# Patient Record
Sex: Female | Born: 1950 | Race: White | Hispanic: Yes | Smoking: Never smoker
Health system: Southern US, Community
[De-identification: ages and names within clinical notes are randomized; demographics above are authoritative.]

## PROBLEM LIST (undated history)

## (undated) DIAGNOSIS — T7840XA Allergy, unspecified, initial encounter: Secondary | ICD-10-CM

## (undated) DIAGNOSIS — R7301 Impaired fasting glucose: Secondary | ICD-10-CM

## (undated) DIAGNOSIS — I10 Essential (primary) hypertension: Secondary | ICD-10-CM

## (undated) DIAGNOSIS — F419 Anxiety disorder, unspecified: Secondary | ICD-10-CM

## (undated) DIAGNOSIS — M1990 Unspecified osteoarthritis, unspecified site: Secondary | ICD-10-CM

## (undated) DIAGNOSIS — F32A Depression, unspecified: Secondary | ICD-10-CM

## (undated) DIAGNOSIS — E559 Vitamin D deficiency, unspecified: Secondary | ICD-10-CM

## (undated) DIAGNOSIS — F329 Major depressive disorder, single episode, unspecified: Secondary | ICD-10-CM

## (undated) DIAGNOSIS — E785 Hyperlipidemia, unspecified: Secondary | ICD-10-CM

## (undated) HISTORY — DX: Hyperlipidemia, unspecified: E78.5

## (undated) HISTORY — DX: Vitamin D deficiency, unspecified: E55.9

## (undated) HISTORY — DX: Depression, unspecified: F32.A

## (undated) HISTORY — DX: Essential (primary) hypertension: I10

## (undated) HISTORY — DX: Major depressive disorder, single episode, unspecified: F32.9

## (undated) HISTORY — DX: Unspecified osteoarthritis, unspecified site: M19.90

## (undated) HISTORY — DX: Impaired fasting glucose: R73.01

## (undated) HISTORY — DX: Anxiety disorder, unspecified: F41.9

## (undated) HISTORY — DX: Allergy, unspecified, initial encounter: T78.40XA

## (undated) HISTORY — PX: TUBAL LIGATION: SHX77

---

## 2009-09-26 ENCOUNTER — Encounter: Admission: RE | Admit: 2009-09-26 | Discharge: 2009-09-26 | Payer: Self-pay | Admitting: Family Medicine

## 2011-04-09 LAB — HM COLONOSCOPY

## 2011-04-18 IMAGING — OT DG DXA BONE DENSITY STUDY HL7
4 series · 4 of 4 positions shown · non-contrast
Comparison: None.

CLINICAL DATA: 58-year-old postmenopausal female with vitamin D
deficiency and hypertension.  The patient takes vitamin D.  She
took hormones for 1 year in the past.

[Series 1: — · left · 1 of 1 slices shown (1 of 4)]
[im 1/1]
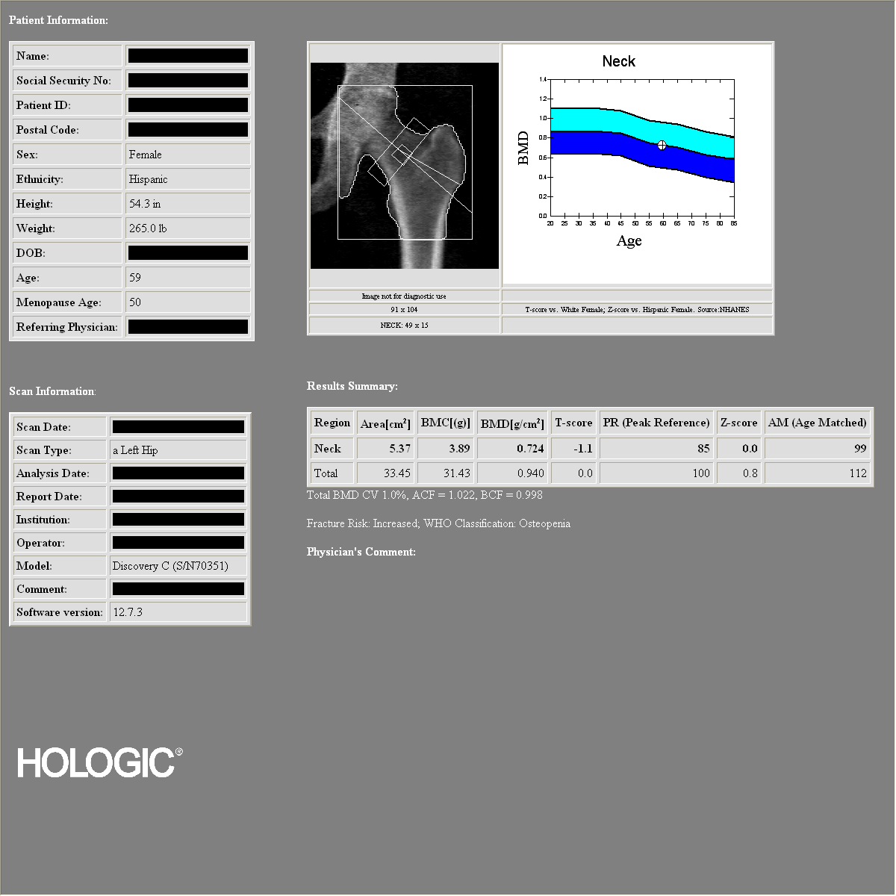

[Series 2: — · 1 of 1 slices shown (2 of 4)]
[im 1/1]
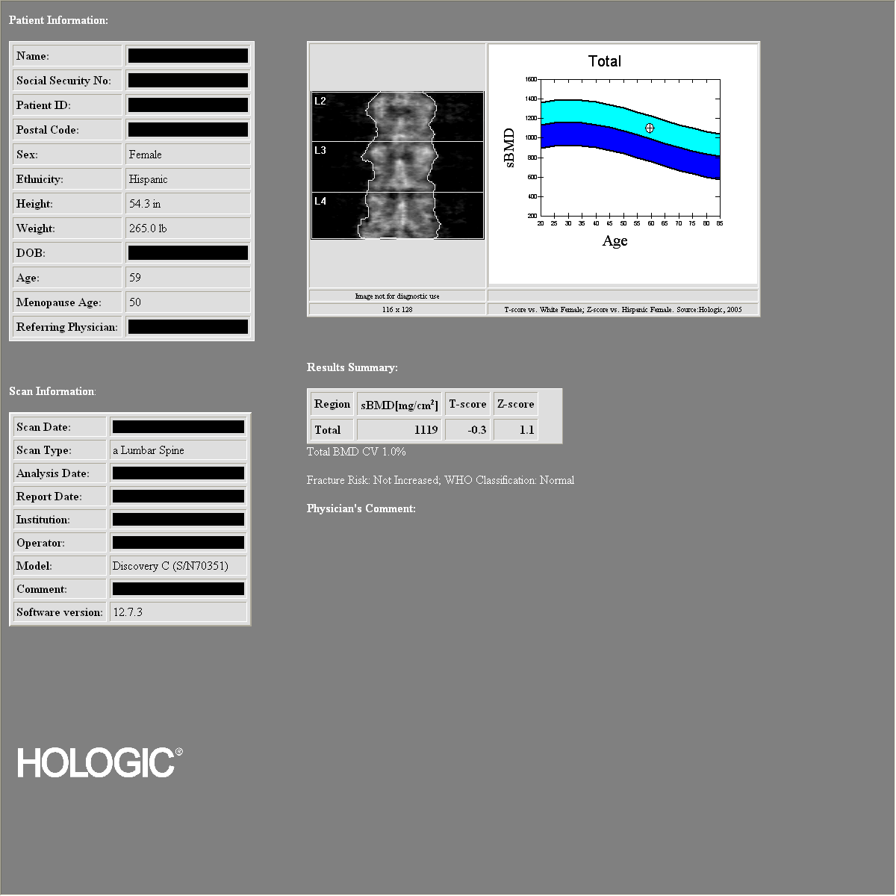

[Series 3: — · left · 1 of 1 slices shown (3 of 4)]
[im 1/1]
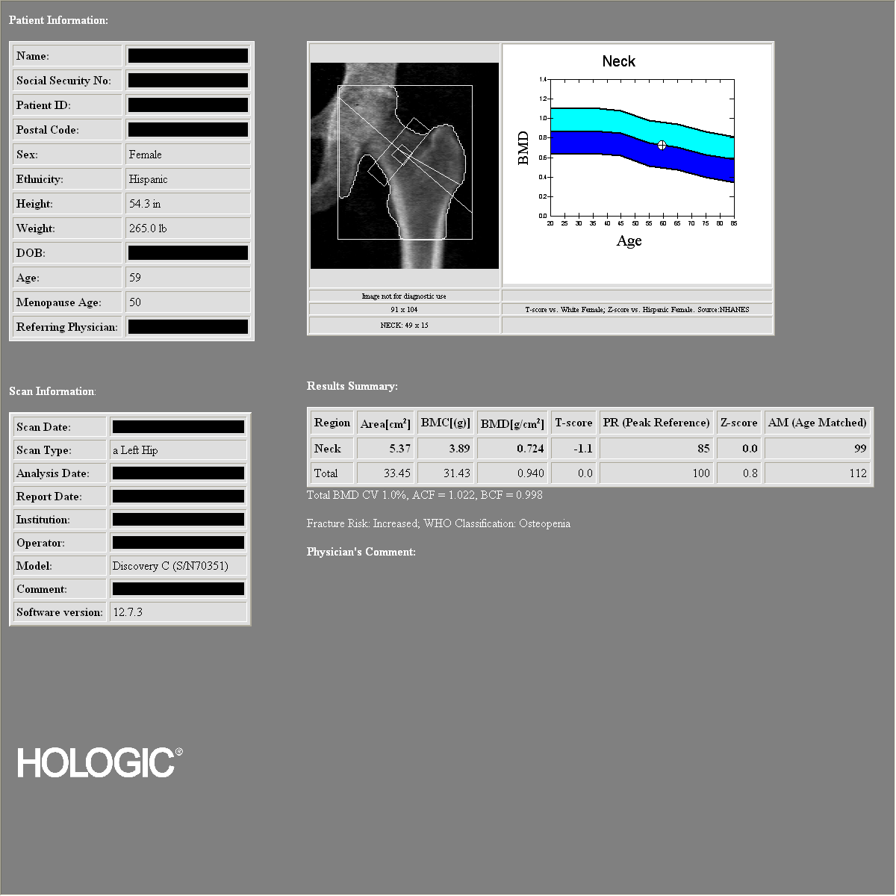

[Series 4: — · 1 of 1 slices shown (4 of 4)]
[im 1/1]
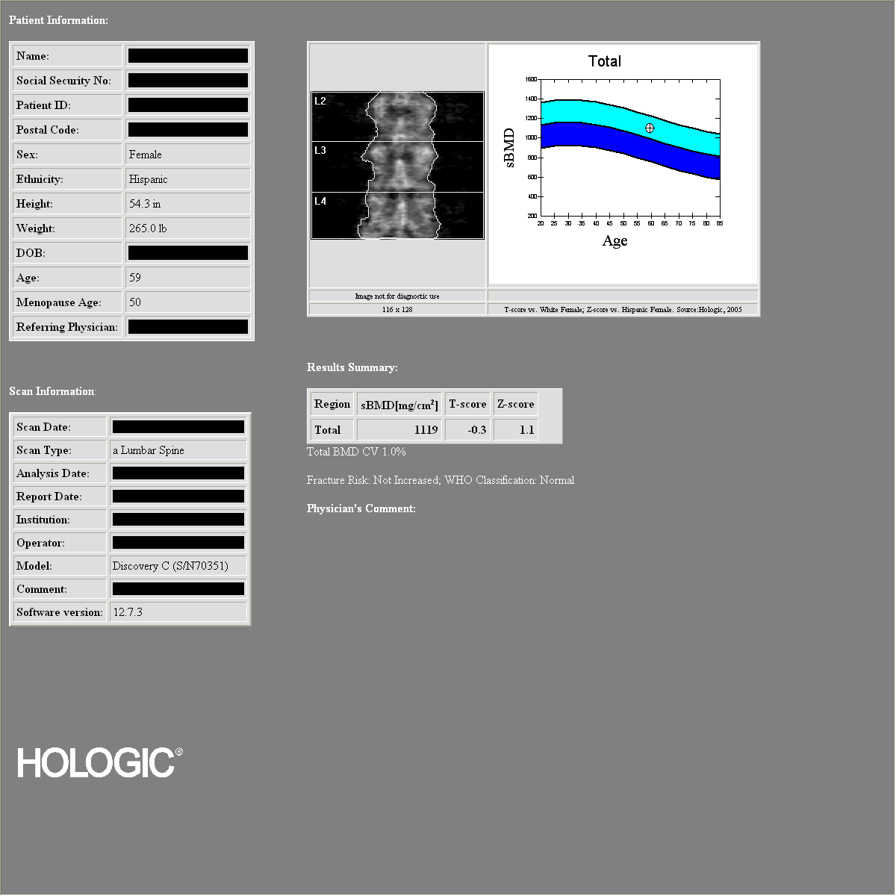

[4 of 4 positions shown; findings below may reference images not displayed]

DUAL X-RAY ABSORPTIOMETRY (DXA) FOR BONE MINERAL DENSITY

AP LUMBAR SPINE (L1 - L4)

Bone Mineral Density (BMD):            1.021 g/cm2
Young Adult T Score:                          -0.2
Z Score:

LEFT FEMUR (NECK)

Bone Mineral Density (BMD):             0.724 g/cm2
Young Adult T Score:                           -1.1
Z Score:

ASSESSMENT:  Patient's diagnostic category is LOW BONE MASS by WHO
Criteria.

FRACTURE RISK: MODERATE

FRAX: Based on the World Health Organization FRAX model, the 10
year probability of a major osteoporotic fracture is 3.2%.  The 10
year probability of a hip fracture is 0.2%.
Please note that data from different machines is
not comparable.

RECOMMENDATIONS:

Effective therapies are available in the form of bisphosphonates,
selective estrogen receptor modulators, biologic agents, and
hormone replacement therapy (for women).  All patients should
ensure an adequate intake of dietary calcium (1200mg daily) and
vitamin D (800 Jeiser Veltran) unless contraindicated.

All treatment decisions require clinical judgement and
consideration of individual patient factors, including patient
preferences, co-morbidities, previous drug use, risk factors not
captured in the FRAX model (e.g., frailty, falls, vitamin D
deficiency, increased bone turnover, interval significant decline
in bone density) and possible under-or over-estimation of fracture
risk by FRAX.

The National Osteoporosis Foundation recommends that FDA-approved
medical therapies be considered in postmenopausal women and mean
age 50 or older with a:

      1)     Hip or vertebral (clinical or morphometric) fracture.

2)    T-score of -2.5 or lower at the spine or hip.
3)    Ten-year fracture probability by FRAX of 3% or greater for
hip fracture or 20% or greater for major osteoporotic fracture.
FOLLOW-UP:

People with diagnosed cases of osteoporosis or at high risk for
fracture should have regular bone mineral density tests.  For
patients eligible for Medicare, routine testing is allowed once
every 2 years.  The testing frequency can be increased to one year
for patients who have rapidly progressing disease, those who are
receiving or discontinuing medical therapy to restore bone mass, or
have additional risk factors.

World Health Organization (WHO) Criteria:

Normal: T scores from +1.0 to -1.0
Low Bone Mass (Osteopenia): T scores between -1.0 and -2.5
Osteoporosis: T scores -2.5 and below

Comparison to Reference Population:

T score is the key measure used in the diagnosis of osteoporosis
and relative risk determination for fracture.  It provides a value
for bone mass relative to the mean bone mass of a young adult
reference population expressed in terms of standard deviation (SD).

Z score is the age-matched score showing the patient's values
compared to a population matched for age, sex, and race.  This is
also expressed in terms of standard deviation.  The patient may
have values that compare favorably to the age-matched values and
still be at increased risk for fracture.

## 2012-03-11 ENCOUNTER — Other Ambulatory Visit: Payer: Self-pay | Admitting: Family Medicine

## 2012-03-11 DIAGNOSIS — Z139 Encounter for screening, unspecified: Secondary | ICD-10-CM

## 2012-03-11 LAB — LIPID PANEL: LDl/HDL Ratio: 5.3

## 2012-03-17 ENCOUNTER — Ambulatory Visit (INDEPENDENT_AMBULATORY_CARE_PROVIDER_SITE_OTHER): Payer: BC Managed Care – PPO

## 2012-03-17 DIAGNOSIS — Z139 Encounter for screening, unspecified: Secondary | ICD-10-CM

## 2012-03-17 DIAGNOSIS — Z1231 Encounter for screening mammogram for malignant neoplasm of breast: Secondary | ICD-10-CM

## 2012-08-25 ENCOUNTER — Ambulatory Visit (INDEPENDENT_AMBULATORY_CARE_PROVIDER_SITE_OTHER): Payer: BC Managed Care – PPO | Admitting: Nurse Practitioner

## 2012-08-25 ENCOUNTER — Encounter: Payer: Self-pay | Admitting: Nurse Practitioner

## 2012-08-25 ENCOUNTER — Encounter: Payer: Self-pay | Admitting: *Deleted

## 2012-08-25 VITALS — BP 124/88 | HR 77 | Temp 98.8°F | Resp 12 | Ht 65.0 in | Wt 257.0 lb

## 2012-08-25 VITALS — BP 124/88 | HR 77 | Temp 98.8°F | Ht 65.0 in | Wt 257.5 lb

## 2012-08-25 DIAGNOSIS — I10 Essential (primary) hypertension: Secondary | ICD-10-CM

## 2012-08-25 DIAGNOSIS — E559 Vitamin D deficiency, unspecified: Secondary | ICD-10-CM

## 2012-08-25 DIAGNOSIS — Z1211 Encounter for screening for malignant neoplasm of colon: Secondary | ICD-10-CM

## 2012-08-25 DIAGNOSIS — F3289 Other specified depressive episodes: Secondary | ICD-10-CM

## 2012-08-25 DIAGNOSIS — E785 Hyperlipidemia, unspecified: Secondary | ICD-10-CM

## 2012-08-25 DIAGNOSIS — F329 Major depressive disorder, single episode, unspecified: Secondary | ICD-10-CM

## 2012-08-25 LAB — COMPREHENSIVE METABOLIC PANEL
Albumin: 3.9 g/dL (ref 3.5–5.2)
Alkaline Phosphatase: 82 U/L (ref 39–117)
BUN: 18 mg/dL (ref 6–23)
Calcium: 9.1 mg/dL (ref 8.4–10.5)
Creatinine, Ser: 0.6 mg/dL (ref 0.4–1.2)
GFR: 114.07 mL/min (ref >60.00)
Glucose, Bld: 85 mg/dL (ref 70–99)
Total Bilirubin: 0.4 mg/dL (ref 0.3–1.2)

## 2012-08-25 LAB — LIPID PANEL
Cholesterol: 239 mg/dL — ABNORMAL HIGH (ref 0–200)
VLDL: 45.2 mg/dL — ABNORMAL HIGH (ref 0.0–40.0)

## 2012-08-25 LAB — POCT URINALYSIS DIPSTICK
Bilirubin, UA: NEGATIVE
Glucose, UA: NEGATIVE
Ketones, UA: NEGATIVE
Spec Grav, UA: 1.025
pH, UA: 5.5

## 2012-08-25 LAB — CBC
HCT: 40.9 % (ref 36.0–46.0)
Hemoglobin: 13.8 g/dL (ref 12.0–15.0)
RDW: 13 % (ref 11.5–14.6)
WBC: 9.5 10*3/uL (ref 4.5–10.5)

## 2012-08-25 MED ORDER — IRBESARTAN 300 MG PO TABS: 300.0000 mg | ORAL_TABLET | Freq: Every day | ORAL | Status: DC

## 2012-08-25 MED ORDER — METOPROLOL SUCCINATE ER 25 MG PO TB24: 25.0000 mg | ORAL_TABLET | Freq: Every day | ORAL | Status: DC

## 2012-08-25 MED ORDER — VENLAFAXINE HCL ER 75 MG PO CP24: 75.0000 mg | ORAL_CAPSULE | Freq: Every day | ORAL | Status: DC

## 2012-08-25 NOTE — Patient Instructions (Signed)
You look great today. I will call you with any abnormal results on labs. Great job with weight loss. Visit the DASH diet website. Keep up the good work! Start aspirin 81 mg enteric coated daily, and Mega red krill oil 1 tab daily. Both are for stroke prevention. We will see you again in December for colon Ca screen and possibly PAP/pelvic exam, and any immunizations you need. We will have flu shots in September. Please call to make appt.for flu shot. We may be able to do a walk-in. Pleasure to meet you today!

## 2012-08-25 NOTE — Progress Notes (Signed)
Subjective:     Brandy Bowman is a 62 y.o. female and is here for a comprehensive physical exam. The patient reports problems - L knee pain, has valgus deformity. History of treated hypertension and depression, taking niacin for hyperlipidemia, 5000 iu daily vit D for vit D deficiency, is morbidly obese, but has lost 20 lbs. in last year and still working on weight loss. Marland Kitchen  History   Social History  . Marital Status: Married    Spouse Name: N/A    Number of Children: 2  . Years of Education: N/A   Occupational History  . retired Agricultural consultant    Social History Main Topics  . Smoking status: Never Smoker   . Smokeless tobacco: Not on file  . Alcohol Use: No  . Drug Use: No  . Sexually Active: No   Other Topics Concern  . 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 Currently lives with husband who is several years older and is independent, but has some forgetfulness. Daughter lives in [place]. Does not have contact with son. Enjoys yard work.    No health maintenance topics applied.  The following portions of the patient's history were reviewed and updated as appropriate: allergies, current medications, past family history, past medical history, past social history, past surgical history and problem list.  Review of Systems A comprehensive review of systems was negative except for: Cardiovascular: positive for hypertension Gastrointestinal: positive for diarrhea and states gets diarrhea when feels anxious, has to find bathroom quickly Hematologic/lymphatic: positive for easy bruising Musculoskeletal: positive for arthralgias and bilateral knee pain with exercise. L knee has valgus deformity, gets stiff with exercise Behavioral/Psych: positive for anxiety, depression and new experiences trigger anxiety symptoms. Allergic/Immunologic: positive for seasonal allergies managed well with zyrtec   Objective:    BP  124/88  Pulse 77  Temp(Src) 98.8 F (37.1 C)  Resp 12  Ht 5\' 5"  (1.651 m)  Wt 257 lb (116.574 kg)  BMI 42.77 kg/m2  SpO2 96% General appearance: alert, cooperative, appears stated age and no distress Head: Normocephalic, without obvious abnormality, atraumatic Eyes: conjunctivae/corneas clear. PERRL, EOM's intact. Fundi benign. Ears: normal TM's and external ear canals both ears Nose: Nares normal. Septum midline. Mucosa normal. No drainage or sinus tenderness. Throat: lips, mucosa, and tongue normal; teeth and gums normal Neck: no adenopathy, no carotid bruit, no JVD, supple, symmetrical, trachea midline and thyroid not enlarged, symmetric, no tenderness/mass/nodules Back: symmetric, no curvature. ROM normal. No CVA tenderness. Lungs: clear to auscultation bilaterally Heart: regular rate and rhythm, S1, S2 normal, no murmur, click, rub or gallop Abdomen: soft, non-tender; bowel sounds normal; no masses,  no organomegaly Extremities: Homans sign is negative, no sign of DVT, no edema, redness or tenderness in the calves or thighs and Valgus deformity of L knee/leg, spider veins L ankle Pulses: 2+ and symmetric Skin: Skin color, texture, turgor normal. No rashes or lesions or large blackhead posterior L shoulder Lymph nodes: Cervical, supraclavicular, and axillary nodes normal.    Assessment:     female exam: Identified concerns- L valgus deformity of knee, causes pain & interferes with exercise; morbid obesity. Currently treated problems: HTN, Depression/anxiety. Hea maintenance: will request records from [facility]-pt uncertain about last PAP and Tdap, undecided about shingles vaccine     Plan:  1. Essential hypertension, benign Well controlled on current meds - CBC - Comprehensive metabolic panel - POCT urinalysis dipstick -  metoprolol succinate (TOPROL-XL) 25 MG 24 hr tablet; Take 1 tablet (25 mg total) by mouth daily.  Dispense: 90 tablet; Refill: 1 - irbesartan (AVAPRO) 300 MG  tablet; Take 1 tablet (300 mg total) by mouth at bedtime.  Dispense: 90 tablet; Refill: 1  2. Depressive disorder, not elsewhere classified Denies panic attacks and suicidal ideation - venlafaxine XR (EFFEXOR-XR) 75 MG 24 hr capsule; Take 1 capsule (75 mg total) by mouth daily.  Dispense: 90 capsule; Refill: 1  3. Other and unspecified hyperlipidemia Currently taking Niacin. Start Mega red krill oil. - Lipid panel  4. Unspecified vitamin D deficiency Taking 5000iu daily.  - Vitamin D 25 hydroxy    See After Visit Summary for Counseling Recommendations

## 2012-08-26 LAB — VITAMIN D 25 HYDROXY (VIT D DEFICIENCY, FRACTURES): Vit D, 25-Hydroxy: 59 ng/mL (ref 30–89)

## 2012-08-26 NOTE — Progress Notes (Signed)
This chart should be combined with Remache, roa chart.

## 2012-08-27 ENCOUNTER — Encounter: Payer: Self-pay | Admitting: Nurse Practitioner

## 2012-08-28 ENCOUNTER — Telehealth: Payer: Self-pay | Admitting: Nurse Practitioner

## 2012-08-28 DIAGNOSIS — E785 Hyperlipidemia, unspecified: Secondary | ICD-10-CM

## 2012-08-28 MED ORDER — ROSUVASTATIN CALCIUM 10 MG PO TABS: 10.0000 mg | ORAL_TABLET | Freq: Every day | ORAL | Status: DC

## 2012-08-28 NOTE — Telephone Encounter (Signed)
See telephone note, new med order.

## 2012-09-01 NOTE — Telephone Encounter (Signed)
Patient appt moved from 02/2013 to 12/02/12.

## 2012-09-08 ENCOUNTER — Encounter: Payer: Self-pay | Admitting: *Deleted

## 2012-09-08 LAB — BASIC METABOLIC PANEL
CO2: 29 mmol/L
Cancer Antigen 125 (CA125): 9.1
EGFR: 110 mg/dL
Glucose: 97 mg/dL
Other: 133

## 2012-11-23 ENCOUNTER — Other Ambulatory Visit: Payer: Self-pay | Admitting: *Deleted

## 2012-11-23 DIAGNOSIS — E785 Hyperlipidemia, unspecified: Secondary | ICD-10-CM

## 2012-11-23 MED ORDER — ROSUVASTATIN CALCIUM 10 MG PO TABS: 10.0000 mg | ORAL_TABLET | Freq: Every day | ORAL | Status: DC

## 2012-11-25 ENCOUNTER — Other Ambulatory Visit: Payer: Self-pay | Admitting: Nurse Practitioner

## 2012-11-25 ENCOUNTER — Encounter: Payer: Self-pay | Admitting: Nurse Practitioner

## 2012-11-25 ENCOUNTER — Ambulatory Visit (INDEPENDENT_AMBULATORY_CARE_PROVIDER_SITE_OTHER): Payer: BC Managed Care – PPO | Admitting: Nurse Practitioner

## 2012-11-25 VITALS — BP 130/84 | HR 75 | Temp 98.6°F | Ht 65.0 in | Wt 259.2 lb

## 2012-11-25 DIAGNOSIS — I10 Essential (primary) hypertension: Secondary | ICD-10-CM

## 2012-11-25 DIAGNOSIS — E559 Vitamin D deficiency, unspecified: Secondary | ICD-10-CM

## 2012-11-25 DIAGNOSIS — E785 Hyperlipidemia, unspecified: Secondary | ICD-10-CM

## 2012-11-25 LAB — URINALYSIS, ROUTINE W REFLEX MICROSCOPIC
Ketones, ur: NEGATIVE
Nitrite: NEGATIVE
Specific Gravity, Urine: >=1.03 (ref 1.000–1.030)
Urine Glucose: NEGATIVE
Urobilinogen, UA: 0.2 (ref 0.0–1.0)

## 2012-11-25 NOTE — Patient Instructions (Addendum)
Our office will call with lab results. Hold off filling prescriptions until I get lab results back, as I may need to change dose. Strive for daily walk-15 to 30 minutes. For continued weight loss, calorie intake goal is 1900-2000 calories daily. This calorie intake should allow you to lose 2-4 pounds each week.  We will request records from orthopedic group. Let us know if you want to start gel injections in the knee. Great to see you!

## 2012-11-25 NOTE — Progress Notes (Signed)
Subjective:    Brandy Bowman is here for follow up of elevated cholesterol and vitamin D deficiency. Additionally , she is treated for hypertension, is morbidly obese, and has chronic knee pain due to osteoarthritis and valgus deformity. Regarding elevated cholesterol, she started 10 mg crestor  3 months ago. Compliance with medication has been good. The patient exercises infrequently. Knee pain makes exercise uncomfortable. Patient denies muscle pain associated with her medications. Regarding Vit D deficiency, she has been taking 50,000 iu weekly, no SE reported.  Regarding HTN, she is well controlled on avapro & toprol, denies cough, chest pain, LE swelling. Regarding knee pain, she has been evaluated by an orthopedist, images including xray and MRI were performed, not available for review. She received steroid injection with temporary relief. Gel injections were recommended at that time.  The following portions of the patient's history were reviewed and updated as appropriate: allergies, current medications, past family history, past medical history, past social history, past surgical history and problem list.  Review of Systems Constitutional: negative for fatigue, fevers, night sweats and positive for 2 pound weight gain Respiratory: negative for cough and sputum Cardiovascular: negative for chest pain, lower extremity edema and palpitations Gastrointestinal: negative for change in bowel habits and diarrhea Integument/breast: positive for dryness and dandruff at scalp, uses dandruff shampoo Musculoskeletal:positive for bilateral knee pain Behavioral/Psych: negative for anxiety, bad mood, decreased appetite, depression, excessive alcohol consumption, increased appetite, irritability, mood swings and sleep disturbance Endocrine: positive for temperature intolerance and dry skin    Objective:    BP 130/84  Pulse 75  Temp(Src) 98.6 F (37 C) (Oral)  Ht 5\' 5"  (1.651 m)  Wt 259 lb 4 oz  (117.595 kg)  BMI 43.14 kg/m2  SpO2 95% General appearance: alert, cooperative, appears stated age and no distress Head: Normocephalic, without obvious abnormality, atraumatic Eyes: negative findings: lids and lashes normal, conjunctivae and sclerae normal, corneas clear and wearing glasses Skin: facial erythema, few scattered telangiectasias  Lab Review Lab Results  Component Value Date   CHOL 239* 08/25/2012   CHOL 220* 03/11/2012   HDL 36.50* 08/25/2012   LDLDIRECT 156.9 08/25/2012      Assessment:   1 elevated cholesterol, taking 10 mg crestor for 3 months.  2 Vit D deficiency, taking 50,000 iu weekly for 12 weeks.  3 BMI > 40, 20 pound intentional weight loss in last year, recent 2 pound gain. 4 HTN, well controlled on toprol & avapro 5 Chronic knee pain  6 prev care review Plan:    1. Continue dietary measures.Continue regular exercise.Continue crestor. Check lipids & hep func for response today. F/u depends on response (3-44mos). 2. Check D3 for response today 3 Encourage daily exercise, does not want to do water exercise, will continue to walk-got walking stick. Gave calorie goal to lose 2-4 pounds weekly 4 Continue meds 5 Discussed gel (Hyalgan) injections. Will request records from ortho. She will let us know if she wants to pursue injections 6 Offered shingles vaccine, declined. Wants to do MMG in 2015 (last 2013 fibroglandular changes, no family history). Declined bone density (last 2011 normal).

## 2012-11-26 LAB — HEPATIC FUNCTION PANEL
ALT: 15 U/L (ref 0–35)
Alkaline Phosphatase: 87 U/L (ref 39–117)
Bilirubin, Direct: 0.1 mg/dL (ref 0.0–0.3)
Indirect Bilirubin: 0.3 mg/dL (ref 0.0–0.9)
Total Bilirubin: 0.4 mg/dL (ref 0.3–1.2)

## 2012-11-26 LAB — LIPID PANEL
Cholesterol: 153 mg/dL (ref 0–200)
HDL: 44 mg/dL (ref >39)
LDL Cholesterol: 81 mg/dL (ref 0–99)
Total CHOL/HDL Ratio: 3.5 Ratio
Triglycerides: 139 mg/dL (ref <150)

## 2012-11-27 ENCOUNTER — Telehealth: Payer: Self-pay | Admitting: Nurse Practitioner

## 2012-11-27 DIAGNOSIS — E559 Vitamin D deficiency, unspecified: Secondary | ICD-10-CM

## 2012-11-27 DIAGNOSIS — I10 Essential (primary) hypertension: Secondary | ICD-10-CM

## 2012-11-27 DIAGNOSIS — E785 Hyperlipidemia, unspecified: Secondary | ICD-10-CM

## 2012-11-27 DIAGNOSIS — F329 Major depressive disorder, single episode, unspecified: Secondary | ICD-10-CM

## 2012-11-27 MED ORDER — VITAMIN D3 125 MCG (5000 UT) PO CAPS: 1.0000 | ORAL_CAPSULE | Freq: Every day | ORAL | Status: DC

## 2012-11-27 MED ORDER — ROSUVASTATIN CALCIUM 10 MG PO TABS: ORAL_TABLET | ORAL | Status: DC

## 2012-11-27 MED ORDER — METOPROLOL SUCCINATE ER 25 MG PO TB24: 25.0000 mg | ORAL_TABLET | Freq: Every day | ORAL | Status: DC

## 2012-11-27 MED ORDER — VENLAFAXINE HCL ER 75 MG PO CP24: 75.0000 mg | ORAL_CAPSULE | Freq: Every day | ORAL | Status: DC

## 2012-11-27 MED ORDER — IRBESARTAN 300 MG PO TABS: 300.0000 mg | ORAL_TABLET | Freq: Every day | ORAL | Status: DC

## 2012-11-27 NOTE — Telephone Encounter (Signed)
Spoke w/pt regarding labs: Hyperlipidemia: all numbers in therapeutic range on crestor 10 mg. Decreased frequency of dose to M-F. Will check lipids in 6 mos. Vit D level has not changed (59) on 5000iu daily in 4 mos. Will continue at 5000 qd. Check in 1 year. Urine has RBC & Ca oxylate crystals- no Hx of stones, will check urine in 6 mos for blood. Advised drink plenty of water to keep crystals diluted & prevent stone formation. Diabetes & thyroid screen negative for both.

## 2012-12-02 ENCOUNTER — Ambulatory Visit: Payer: BC Managed Care – PPO | Admitting: Nurse Practitioner

## 2013-02-11 ENCOUNTER — Other Ambulatory Visit: Payer: Self-pay

## 2013-02-25 ENCOUNTER — Ambulatory Visit: Payer: BC Managed Care – PPO | Admitting: Nurse Practitioner

## 2013-03-08 ENCOUNTER — Other Ambulatory Visit: Payer: Self-pay | Admitting: *Deleted

## 2013-03-08 DIAGNOSIS — F329 Major depressive disorder, single episode, unspecified: Secondary | ICD-10-CM

## 2013-03-08 DIAGNOSIS — I10 Essential (primary) hypertension: Secondary | ICD-10-CM

## 2013-03-08 MED ORDER — VENLAFAXINE HCL ER 75 MG PO CP24: 75.0000 mg | ORAL_CAPSULE | Freq: Every day | ORAL | Status: DC

## 2013-03-08 MED ORDER — IRBESARTAN 300 MG PO TABS
300.0000 mg | ORAL_TABLET | Freq: Every day | ORAL | Status: DC
Start: 2013-03-08 — End: 2013-08-19

## 2013-03-08 MED ORDER — METOPROLOL SUCCINATE ER 25 MG PO TB24: 25.0000 mg | ORAL_TABLET | Freq: Every day | ORAL | Status: DC

## 2013-05-28 ENCOUNTER — Ambulatory Visit (INDEPENDENT_AMBULATORY_CARE_PROVIDER_SITE_OTHER): Payer: BC Managed Care – PPO | Admitting: Nurse Practitioner

## 2013-05-28 ENCOUNTER — Encounter: Payer: Self-pay | Admitting: Nurse Practitioner

## 2013-05-28 ENCOUNTER — Ambulatory Visit: Payer: BC Managed Care – PPO

## 2013-05-28 VITALS — BP 135/74 | HR 84 | Temp 98.5°F | Resp 16 | Ht 65.0 in | Wt 265.0 lb

## 2013-05-28 DIAGNOSIS — E785 Hyperlipidemia, unspecified: Secondary | ICD-10-CM

## 2013-05-28 DIAGNOSIS — I10 Essential (primary) hypertension: Secondary | ICD-10-CM

## 2013-05-28 DIAGNOSIS — IMO0002 Reserved for concepts with insufficient information to code with codable children: Secondary | ICD-10-CM

## 2013-05-28 DIAGNOSIS — F329 Major depressive disorder, single episode, unspecified: Secondary | ICD-10-CM

## 2013-05-28 DIAGNOSIS — F3289 Other specified depressive episodes: Secondary | ICD-10-CM

## 2013-05-28 DIAGNOSIS — E559 Vitamin D deficiency, unspecified: Secondary | ICD-10-CM

## 2013-05-28 DIAGNOSIS — M1710 Unilateral primary osteoarthritis, unspecified knee: Secondary | ICD-10-CM

## 2013-05-28 LAB — COMPREHENSIVE METABOLIC PANEL
ALBUMIN: 3.9 g/dL (ref 3.5–5.2)
ALT: 21 U/L (ref 0–35)
AST: 19 U/L (ref 0–37)
Alkaline Phosphatase: 83 U/L (ref 39–117)
BUN: 17 mg/dL (ref 6–23)
CHLORIDE: 105 meq/L (ref 96–112)
CO2: 30 mEq/L (ref 19–32)
CREATININE: 0.6 mg/dL (ref 0.4–1.2)
Calcium: 9 mg/dL (ref 8.4–10.5)
GFR: 111.53 mL/min (ref >60.00)
GLUCOSE: 91 mg/dL (ref 70–99)
POTASSIUM: 4.1 meq/L (ref 3.5–5.1)
Sodium: 140 mEq/L (ref 135–145)
TOTAL PROTEIN: 7.2 g/dL (ref 6.0–8.3)
Total Bilirubin: 0.4 mg/dL (ref 0.3–1.2)

## 2013-05-28 LAB — POCT URINALYSIS DIPSTICK
BILIRUBIN UA: NEGATIVE
GLUCOSE UA: NEGATIVE
Ketones, UA: NEGATIVE
Nitrite, UA: NEGATIVE
PH UA: 6
PROTEIN UA: NEGATIVE
SPEC GRAV UA: 1.025
Urobilinogen, UA: 0.2

## 2013-05-28 LAB — MICROALBUMIN / CREATININE URINE RATIO
CREATININE, U: 114.2 mg/dL
Microalb Creat Ratio: 2.7 mg/g (ref 0.0–30.0)
Microalb, Ur: 3.1 mg/dL — ABNORMAL HIGH (ref 0.0–1.9)

## 2013-05-28 LAB — LIPID PANEL
CHOLESTEROL: 150 mg/dL (ref 0–200)
HDL: 45 mg/dL (ref >39.00)
LDL Cholesterol: 76 mg/dL (ref 0–99)
Total CHOL/HDL Ratio: 3
Triglycerides: 146 mg/dL (ref 0.0–149.0)
VLDL: 29.2 mg/dL (ref 0.0–40.0)

## 2013-05-28 MED ORDER — DICLOFENAC SODIUM 1 % TD GEL: TRANSDERMAL | Status: DC

## 2013-05-28 NOTE — Assessment & Plan Note (Signed)
75 mg qd. manages symptoms. Continue at current dose. F/u 6 mos. Tried 37.5 mg and had worsened symptoms.

## 2013-05-28 NOTE — Assessment & Plan Note (Signed)
BP goal: <150/90. Continue current meds. Tolerating w/no SE such as cough, cp, SOB or LE edema . Consider decreasing toprol to 12.5 mg XR if microalb, Creat & BUN nml. Discussed diet changes-DASH, Encouraged exercise: 3 - 10 minute walks daily. CMET, microalbumin today. F/u 6 mo or sooner if med changes based on lab results.

## 2013-05-28 NOTE — Assessment & Plan Note (Signed)
Pt reports taking 5000 iu qd. Will check Vit D today & make any necessary adjustment in supplement. No S &S of D toxicity (metallic taste, constipation, dry mouth, HA, nausea).

## 2013-05-28 NOTE — Patient Instructions (Signed)
Use volataren gel on knees twice daily. Try to get in 3  10 minute walks daily.  Read about the DASH diet. See you in 6 mos.  Great to see you!  DASH Diet The DASH diet stands for "Dietary Approaches to Stop Hypertension." It is a healthy eating plan that has been shown to reduce high blood pressure (hypertension) in as little as 14 days, while also possibly providing other significant health benefits. These other health benefits include reducing the risk of breast cancer after menopause and reducing the risk of type 2 diabetes, heart disease, colon cancer, and stroke. Health benefits also include weight loss and slowing kidney failure in patients with chronic kidney disease.  DIET GUIDELINES  Limit salt (sodium). Your diet should contain less than 1500 mg of sodium daily.  Limit refined or processed carbohydrates. Your diet should include mostly whole grains. Desserts and added sugars should be used sparingly.  Include small amounts of heart-healthy fats. These types of fats include nuts, oils, and tub margarine. Limit saturated and trans fats. These fats have been shown to be harmful in the body. CHOOSING FOODS  The following food groups are based on a 2000 calorie diet. See your Registered Dietitian for individual calorie needs. Grains and Grain Products (6 to 8 servings daily)  Eat More Often: Whole-wheat bread, brown rice, whole-grain or wheat pasta, quinoa, popcorn without added fat or salt (air popped).  Eat Less Often: White bread, white pasta, white rice, cornbread. Vegetables (4 to 5 servings daily)  Eat More Often: Fresh, frozen, and canned vegetables. Vegetables may be raw, steamed, roasted, or grilled with a minimal amount of fat.  Eat Less Often/Avoid: Creamed or fried vegetables. Vegetables in a cheese sauce. Fruit (4 to 5 servings daily)  Eat More Often: All fresh, canned (in natural juice), or frozen fruits. Dried fruits without added sugar. One hundred percent fruit juice  ( cup [237 mL] daily).  Eat Less Often: Dried fruits with added sugar. Canned fruit in light or heavy syrup. Foot LockerLean Meats, Fish, and Poultry (2 servings or less daily. One serving is 3 to 4 oz [85-114 g]).  Eat More Often: Ninety percent or leaner ground beef, tenderloin, sirloin. Round cuts of beef, chicken breast, Malaw[place]urkey breast. All fish. Grill, bake, or broil your meat. Nothing should be fried.  Eat Less Often/Avoid: Fatty cuts of meat, [place]key, or chicken leg, thigh, or wing. Fried cuts of meat or fish. Dairy (2 to 3 servings)  Eat More Often: Low-fat or fat-free milk, low-fat plain or light yogurt, reduced-fat or part-skim cheese.  Eat Less Often/Avoid: Milk (whole, 2%).Whole milk yogurt. Full-fat cheeses. Nuts, Seeds, and Legumes (4 to 5 servings per week)  Eat More Often: All without added salt.  Eat Less Often/Avoid: Salted nuts and seeds, canned beans with added salt. Fats and Sweets (limited)  Eat More Often: Vegetable oils, tub margarines without trans fats, sugar-free gelatin. Mayonnaise and salad dressings.  Eat Less Often/Avoid: Coconut oils, palm oils, butter, stick margarine, cream, half and half, cookies, candy, pie. FOR MORE INFORMATION The Dash Diet Eating Plan: www.dashdiet.org Document Released: 03/14/2011 Document Revised: 06/17/2011 Document Reviewed: 03/14/2011 [facility] Patient Information 2014 [place], [place].

## 2013-05-28 NOTE — Assessment & Plan Note (Addendum)
Valgus deformity bilat, l>r. Flexion limited to 90 bilat. Full extension. No joint laxity. Chronic pain that limits activity -pt reports can walk about 20 minutes without severe pain. Climbing stairs in home-not considering 1 level home.  Will try voltaren gel for pain. Consider celebrex if no relief. 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

## 2013-05-28 NOTE — Assessment & Plan Note (Signed)
Pt is motivated to lose weight but activity is limited as are food preferences. Weight loss goal not established. Discussed food substitutes: whole grain flours for refined, carb snacks for high protein snacks (nuts). Encourage daily exercise-pt has treadmill, 3- 10 minute walks daily.

## 2013-05-28 NOTE — Assessment & Plan Note (Signed)
Continue crestor 10 mg qd. Encourage cut out sugar & refined flours. Daily exercise. Lipid panel today. May switch to simvastatin if crestor becomes too expensive w/new ins. Changes.

## 2013-05-28 NOTE — Progress Notes (Signed)
Pre visit review using our clinic review tool, if applicable. No additional management support is needed unless otherwise documented below in the visit note. 

## 2013-05-29 LAB — VITAMIN D 25 HYDROXY (VIT D DEFICIENCY, FRACTURES): Vit D, 25-Hydroxy: 69 ng/mL (ref 30–89)

## 2013-05-31 ENCOUNTER — Telehealth: Payer: Self-pay | Admitting: Nurse Practitioner

## 2013-05-31 NOTE — Progress Notes (Signed)
Subjective:     Brandy Bowman is a 63 y.o. female. She presents for f/u of chronic conditions: HTN, hyperlipidemia, chronic knee pain, depression, obesity, & vitamin D deficiency.   The following portions of the patient's history were reviewed and updated as appropriate: allergies, current medications, past medical history, past social history, past surgical history and problem list.  Review of Systems Constitutional: negative for fatigue, fevers and night sweats Respiratory: negative Cardiovascular: negative for chest pressure/discomfort, irregular heart beat and lower extremity edema Musculoskeletal:positive for chronic knee pain    Objective:    BP 135/74  Pulse 84  Temp(Src) 98.5 F (36.9 C) (Oral)  Resp 16  Ht 5\' 5"  (1.651 m)  Wt 265 lb (120.203 kg)  BMI 44.10 kg/m2  SpO2 94% BP 135/74  Pulse 84  Temp(Src) 98.5 F (36.9 C) (Oral)  Resp 16  Ht 5\' 5"  (1.651 m)  Wt 265 lb (120.203 kg)  BMI 44.10 kg/m2  SpO2 94% General appearance: alert, cooperative, appears stated age, no distress and morbidly obese Head: Normocephalic, without obvious abnormality, atraumatic Eyes: negative findings: lids and lashes normal and conjunctivae and sclerae normal Lungs: clear to auscultation bilaterally Heart: regular rate and rhythm, S1, S2 normal, no murmur, click, rub or gallop Extremities: extremities normal, atraumatic, no cyanosis or edema Pulses: 2+ and symmetric    Assessment:     1. Other and unspecified hyperlipidemia   2. Essential hypertension, benign   3. Depressive disorder, not elsewhere classified   4. Unspecified vitamin D deficiency   5. Morbid obesity   6. Tricompartment osteoarthritis of knee    valgus deformity bilat knees     Plan:     See prob list for plan.

## 2013-05-31 NOTE — Telephone Encounter (Signed)
Relevant patient education assigned to patient using Emmi. ° °

## 2013-06-01 ENCOUNTER — Telehealth: Payer: Self-pay | Admitting: Nurse Practitioner

## 2013-06-01 DIAGNOSIS — I10 Essential (primary) hypertension: Secondary | ICD-10-CM

## 2013-06-01 DIAGNOSIS — E559 Vitamin D deficiency, unspecified: Secondary | ICD-10-CM

## 2013-06-01 MED ORDER — METOPROLOL SUCCINATE ER 25 MG PO TB24: 12.5000 mg | ORAL_TABLET | Freq: Every day | ORAL | Status: DC

## 2013-06-01 MED ORDER — VITAMIN D3 125 MCG (5000 UT) PO CAPS: 1.0000 | ORAL_CAPSULE | ORAL | Status: DC

## 2013-06-01 NOTE — Telephone Encounter (Signed)
BP goal: 150/90. Decrease toprol to 1/2 Tab qd. Cont ARB at 300 mg qd. Mild microalbuminuria. Monitor. Blood in dip urine. Check again in 2 weeks. Will refer to urology if 2 positives. Decrease Vit D to 5000 iu qw. Check in 6 mos. Chol looks good. Continue crestor at 10 mg qd. Pt will call ofc to sched appt. In 2 wks.

## 2013-06-14 ENCOUNTER — Encounter: Payer: Self-pay | Admitting: Nurse Practitioner

## 2013-06-14 ENCOUNTER — Ambulatory Visit: Payer: BC Managed Care – PPO | Admitting: Nurse Practitioner

## 2013-06-14 ENCOUNTER — Ambulatory Visit (INDEPENDENT_AMBULATORY_CARE_PROVIDER_SITE_OTHER): Payer: BC Managed Care – PPO | Admitting: Nurse Practitioner

## 2013-06-14 VITALS — BP 125/69 | HR 77 | Temp 98.2°F | Ht 65.0 in | Wt 266.0 lb

## 2013-06-14 DIAGNOSIS — F329 Major depressive disorder, single episode, unspecified: Secondary | ICD-10-CM

## 2013-06-14 DIAGNOSIS — R809 Proteinuria, unspecified: Secondary | ICD-10-CM | POA: Insufficient documentation

## 2013-06-14 DIAGNOSIS — R3129 Other microscopic hematuria: Secondary | ICD-10-CM

## 2013-06-14 DIAGNOSIS — F3289 Other specified depressive episodes: Secondary | ICD-10-CM

## 2013-06-14 DIAGNOSIS — I10 Essential (primary) hypertension: Secondary | ICD-10-CM

## 2013-06-14 LAB — URINALYSIS, MICROSCOPIC ONLY

## 2013-06-14 NOTE — Patient Instructions (Signed)
Our office will call with lab results. Stop metoprolol. Your blood pressure is well controlled. Check pressure at home (sit 5 mins, cuff at level of heart, feet flat on floor, cuff size should be large). If pressure is over 150/90 please call us. Three 10 minute walks every day:) Consider natural anti-inflammatories to help with knee pain: Fresh ginger tea daily (grate 1-2 TBLS fresh ginger in & steep in hot water for 5 minutes); handful tart cherries or 1/2 cup tart cherry juice daily; 2-3 curmarin capsules daily. Start 1 supplement at a time & take for 2 weeks before adding a second or third, if needed.

## 2013-06-14 NOTE — Assessment & Plan Note (Signed)
BP well controlled today after decreasing metoprolol to 12.5 mg 2 wks ago. Plan: d/c toprol. Continue irbesartan at 300mg  qd. Pt will check BP at home. Will call if over 150/90. Encourage 3  10 min. Walks daily. F/u 6 mos.

## 2013-06-14 NOTE — Assessment & Plan Note (Signed)
Depression r/t marital concerns. Does not wish to speak with counselor. Pt describes herself as a Futures trader"loner". She enjoys her pets & books. Feels medication helps. No SI.

## 2013-06-14 NOTE — Addendum Note (Signed)
Addended by: Cydney OkAUGUSTIN, Erskin Zinda N on: 06/14/2013 01:42 PM   Modules accepted: Orders

## 2013-06-14 NOTE — Assessment & Plan Note (Signed)
Microscopic urine today. If 2 pos, will send to urology for work-up.

## 2013-06-14 NOTE — Progress Notes (Signed)
Subjective:    Patient here for follow-up of medication adjustment for HTN. Last visit, toprol was decreased to 12.5 mg , continues  On 300 mg irbesartan. She is not exercising and is not adherent to a low-salt diet.  Blood pressure is well controlled at home. Cardiac symptoms: none. Patient denies: chest pressure/discomfort and lower extremity edema. Cardiovascular risk factors: dyslipidemia, hypertension, microalbuminuria, obesity (BMI >= 30 kg/m2) and sedentary lifestyle. Use of agents associated with hypertension: none. History of target organ damage: none.  Last visit, she had blood in urine.   The following portions of the patient's history were reviewed and updated as appropriate: allergies, current medications, past family history, past medical history, past social history, past surgical history and problem list.  Review of Systems Constitutional: negative for chills, fatigue and weight loss Respiratory: negative for cough, sputum and wheezing Cardiovascular: negative for chest pain, chest pressure/discomfort, fatigue, irregular heart beat, lower extremity edema and near-syncope Gastrointestinal: negative for abdominal pain, change in bowel habits, constipation and diarrhea Behavioral/Psych: positive for depression, negative for anxiety, excessive alcohol consumption, illegal drug usage, sleep disturbance and tobacco use     Objective:    BP 125/69  Pulse 77  Temp(Src) 98.2 F (36.8 C) (Oral)  Ht 5\' 5"  (1.651 m)  Wt 266 lb (120.657 kg)  BMI 44.26 kg/m2  SpO2 96% General appearance: alert, cooperative, appears stated age and no distress Head: Normocephalic, without obvious abnormality, atraumatic Eyes: negative findings: lids and lashes normal and conjunctivae and sclerae normal Lungs: clear to auscultation bilaterally Heart: regular rate and rhythm, S1, S2 normal, no murmur, click, rub or gallop Extremities: extremities normal, atraumatic, no cyanosis or edema    Assessment:     Hypertension, normal blood pressure . Evidence of target organ damage: none.  Hematuria, microscopic  Plan:   1 d/c toprol, continue ibesartan 300 mg. Pt will call if bp >150/90. Encourage exercise. 2 urine micro today. Will ref to urology if 2nd pos for blood. F/u 6 mos.

## 2013-06-14 NOTE — Progress Notes (Signed)
Pre visit review using our clinic review tool, if applicable. No additional management support is needed unless otherwise documented below in the visit note. 

## 2013-08-19 ENCOUNTER — Other Ambulatory Visit: Payer: Self-pay | Admitting: *Deleted

## 2013-08-19 DIAGNOSIS — I10 Essential (primary) hypertension: Secondary | ICD-10-CM

## 2013-08-19 DIAGNOSIS — F3289 Other specified depressive episodes: Secondary | ICD-10-CM

## 2013-08-19 DIAGNOSIS — F329 Major depressive disorder, single episode, unspecified: Secondary | ICD-10-CM

## 2013-08-19 MED ORDER — IRBESARTAN 300 MG PO TABS: 300.0000 mg | ORAL_TABLET | Freq: Every day | ORAL | Status: DC

## 2013-08-19 MED ORDER — VENLAFAXINE HCL ER 75 MG PO CP24: 75.0000 mg | ORAL_CAPSULE | Freq: Every day | ORAL | Status: DC

## 2013-08-19 NOTE — Telephone Encounter (Signed)
Refill request for effexor Last filled by MD on - 03/08/2013 #90 x1 Last Appt: 06/14/2013 Next Appt: 11/26/2013 Please advise refill?

## 2013-09-16 ENCOUNTER — Other Ambulatory Visit: Payer: Self-pay | Admitting: *Deleted

## 2013-09-16 DIAGNOSIS — E785 Hyperlipidemia, unspecified: Secondary | ICD-10-CM

## 2013-09-16 MED ORDER — ROSUVASTATIN CALCIUM 10 MG PO TABS: ORAL_TABLET | ORAL | Status: DC

## 2013-09-17 ENCOUNTER — Telehealth: Payer: Self-pay | Admitting: Nurse Practitioner

## 2013-09-17 DIAGNOSIS — E785 Hyperlipidemia, unspecified: Secondary | ICD-10-CM

## 2013-09-17 MED ORDER — ROSUVASTATIN CALCIUM 10 MG PO TABS: ORAL_TABLET | ORAL | Status: DC

## 2013-09-17 NOTE — Telephone Encounter (Signed)
[facility] and she is out of the medication. She needs to have [facility] [place] because [facility] is out

## 2013-11-25 ENCOUNTER — Encounter: Payer: Self-pay | Admitting: Nurse Practitioner

## 2013-11-25 ENCOUNTER — Ambulatory Visit (INDEPENDENT_AMBULATORY_CARE_PROVIDER_SITE_OTHER): Payer: BC Managed Care – PPO | Admitting: Nurse Practitioner

## 2013-11-25 VITALS — BP 113/75 | HR 80 | Temp 97.2°F | Ht 65.0 in | Wt 256.0 lb

## 2013-11-25 DIAGNOSIS — IMO0002 Reserved for concepts with insufficient information to code with codable children: Secondary | ICD-10-CM

## 2013-11-25 DIAGNOSIS — R82998 Other abnormal findings in urine: Secondary | ICD-10-CM

## 2013-11-25 DIAGNOSIS — Z0000 Encounter for general adult medical examination without abnormal findings: Secondary | ICD-10-CM

## 2013-11-25 DIAGNOSIS — E559 Vitamin D deficiency, unspecified: Secondary | ICD-10-CM

## 2013-11-25 DIAGNOSIS — I10 Essential (primary) hypertension: Secondary | ICD-10-CM

## 2013-11-25 DIAGNOSIS — M1710 Unilateral primary osteoarthritis, unspecified knee: Secondary | ICD-10-CM

## 2013-11-25 DIAGNOSIS — R8290 Unspecified abnormal findings in urine: Secondary | ICD-10-CM

## 2013-11-25 DIAGNOSIS — E785 Hyperlipidemia, unspecified: Secondary | ICD-10-CM

## 2013-11-25 LAB — URINALYSIS, MICROSCOPIC ONLY

## 2013-11-25 LAB — CBC
HEMATOCRIT: 40.5 % (ref 36.0–46.0)
HEMOGLOBIN: 13.5 g/dL (ref 12.0–15.0)
MCHC: 33.3 g/dL (ref 30.0–36.0)
MCV: 90 fl (ref 78.0–100.0)
Platelets: 294 10*3/uL (ref 150.0–400.0)
RBC: 4.5 Mil/uL (ref 3.87–5.11)
RDW: 13.4 % (ref 11.5–15.5)
WBC: 9 10*3/uL (ref 4.0–10.5)

## 2013-11-25 LAB — HEMOGLOBIN A1C: Hgb A1c MFr Bld: 5.9 % (ref 4.6–6.5)

## 2013-11-25 LAB — VITAMIN D 25 HYDROXY (VIT D DEFICIENCY, FRACTURES): VITD: 30.97 ng/mL (ref 30.00–100.00)

## 2013-11-25 LAB — TSH: TSH: 2.08 u[IU]/mL (ref 0.35–4.50)

## 2013-11-25 LAB — T4, FREE: Free T4: 0.84 ng/dL (ref 0.60–1.60)

## 2013-11-25 MED ORDER — IRBESARTAN 150 MG PO TABS: 225.0000 mg | ORAL_TABLET | Freq: Every day | ORAL | Status: DC

## 2013-11-25 MED ORDER — ROSUVASTATIN CALCIUM 10 MG PO TABS: ORAL_TABLET | ORAL | Status: DC

## 2013-11-25 NOTE — Patient Instructions (Signed)
Great job with food changes!   Start new dose blood pressure medicine. Check blood pressure 3 times each week for 3 weeks. Call me if above 135/85. When checking blood pressure: feet flat on floor, cuff at level of heart, Use appropriate size cuff, sit for 5 minutes before checking pressure. Check at same time each day.  Consider getting shingles at a [facility] or [facility].  Nice to see you!

## 2013-11-25 NOTE — Assessment & Plan Note (Signed)
Great control on irbesartan 300 mg qd. 113/75. Mildly elevated microalbuminuria. Epithelial cells in urine. Creat & BUN nml. Recent 10 lb. Wt loss. Will decrease irbesartan to 225 mg qd. Pt will MONITOR AT HOME & call if over 135/85. Urine micro today.

## 2013-11-25 NOTE — Assessment & Plan Note (Addendum)
Recent 10 lb. Weight loss! Due to cutting portions. Plans to continue w/food changes. Wants to lose in order to have less knee pain. A1C, TSH, T4 today.

## 2013-11-25 NOTE — Assessment & Plan Note (Signed)
Voltaren gel moderately helpful. Taking aspirin. Wearing shoes that have good Arch support to help correct over-pronation.  Does not want knee replacement.

## 2013-11-25 NOTE — Assessment & Plan Note (Signed)
Vaccines UTD, recommend shingles. Does not know when she had pap last, does not want one. Discussed incidence of cervical ca at 63 is low. Aware to let me know of any concerning symptoms such as bleeding. MMG due 12/'15. Had colonoscopy in 2013. CBC today.

## 2013-11-25 NOTE — Progress Notes (Signed)
Pre visit review using our clinic review tool, if applicable. No additional management support is needed unless otherwise documented below in the visit note. 

## 2013-11-25 NOTE — Assessment & Plan Note (Signed)
Decreased dose last OV, will check level today.

## 2013-11-25 NOTE — Progress Notes (Signed)
Subjective:     Brandy Bowman is a 63 y.o. female presents for f/u of HTN, hyperlipidemia, knee pain, & BMI >40. Blood pressure is well controlled on irbesartan 300 mg qd. She tolerated stopping beta blocker with no adverse events. She has epithelial & squamous cells in urine & mild elevation of microalb. BUN & creat nml.  Lipids well controlled on 10 mg crestor. Tolerating without SE. Knee pain is ongoing. She has valgus deformity-L>R and tricompartmental arthritis. She does not want to take NSAIDS due to stroke warnings. She is taking daily aspiring & using voltaren gel as needed. She is working on weight loss. She is careful to wear shoes w/good arch support to help w/over-pronation. She is not interested in consulting w.ortho for knee replacement. She lost 10 lbs since last visit by cutting portions sizes & limiting sugar. She plans to continue w/changes.  The following portions of the patient's history were reviewed and updated as appropriate: allergies, current medications, past medical history, past social history, past surgical history and problem list.  Review of Systems Pertinent items are noted in HPI.    Objective:    BP 113/75  Pulse 80  Temp(Src) 97.2 F (36.2 C) (Temporal)  Ht 5\' 5"  (1.651 m)  Wt 256 lb (116.121 kg)  BMI 42.60 kg/m2  SpO2 96% BP 113/75  Pulse 80  Temp(Src) 97.2 F (36.2 C) (Temporal)  Ht 5\' 5"  (1.651 m)  Wt 256 lb (116.121 kg)  BMI 42.60 kg/m2  SpO2 96% General appearance: alert, cooperative, appears stated age and no distress Head: Normocephalic, without obvious abnormality, atraumatic Eyes: negative findings: lids and lashes normal and conjunctivae and sclerae normal Neck: no adenopathy, no carotid bruit, supple, symmetrical, trachea midline and thyroid not enlarged, symmetric, no tenderness/mass/nodules Lungs: clear to auscultation bilaterally Heart: regular rate and rhythm, S1, S2 normal, no murmur, click, rub or gallop Extremities:  extremities normal, atraumatic, no cyanosis or edema and varicose veins noted Pulses: 2+ and symmetric Lymph nodes: Cervical, supraclavicular, and axillary nodes normal.    Assessment:   1. Preventative health care - CBC - Vit D  25 hydroxy (rtn osteoporosis monitoring)  2. Severe obesity (BMI >= 40) - Hemoglobin A1c - T4, free - TSH  3. Abnormal urinalysis - Urine Microscopic  4. Other and unspecified hyperlipidemia - rosuvastatin (CRESTOR) 10 MG tablet; Take 1T po Monday through Friday.  Dispense: 24 tablet; Refill: 0  5. Essential hypertension, benign - irbesartan (AVAPRO) 150 MG tablet; Take 1.5 tablets (225 mg total) by mouth at bedtime.  Dispense: 135 tablet; Refill: 1  6. Tricompartment osteoarthritis of knee  7. Unspecified vitamin D deficiency  See problem list for complete A&P See pt instructions. F/u 6 mos.

## 2013-11-26 ENCOUNTER — Ambulatory Visit: Payer: BC Managed Care – PPO | Admitting: Nurse Practitioner

## 2013-11-30 ENCOUNTER — Telehealth: Payer: Self-pay | Admitting: Nurse Practitioner

## 2013-11-30 ENCOUNTER — Other Ambulatory Visit: Payer: Self-pay

## 2013-11-30 NOTE — Telephone Encounter (Signed)
Spoke with pt, advised lab results. Pt understood. 

## 2013-11-30 NOTE — Telephone Encounter (Signed)
pls call pt: Advise Labs look good except vit D low & diabetes screen shows she is pre-diabetic. She should start D3 2000 iu daily, taken with meal.  She may schedule appt. To discuss interventions & programs to decrease risk of developing diabetes. Optional.

## 2014-01-27 ENCOUNTER — Telehealth: Payer: Self-pay | Admitting: *Deleted

## 2014-01-27 DIAGNOSIS — F32A Depression, unspecified: Secondary | ICD-10-CM

## 2014-01-27 DIAGNOSIS — F329 Major depressive disorder, single episode, unspecified: Secondary | ICD-10-CM

## 2014-01-27 NOTE — Telephone Encounter (Signed)
Refill request for Effexor Last filled by MD on- 08/19/2013 #90 x1 Last Appt: 11/25/2013 Next Appt: 05/27/2014 Please advise refill?

## 2014-01-28 MED ORDER — VENLAFAXINE HCL ER 75 MG PO CP24: 75.0000 mg | ORAL_CAPSULE | Freq: Every day | ORAL | Status: DC

## 2014-01-28 NOTE — Telephone Encounter (Signed)
Yes, please refill. 90 T, 2 refills.

## 2014-02-24 ENCOUNTER — Other Ambulatory Visit: Payer: Self-pay

## 2014-02-24 DIAGNOSIS — E785 Hyperlipidemia, unspecified: Secondary | ICD-10-CM

## 2014-02-24 MED ORDER — DICLOFENAC SODIUM 1 % TD GEL
TRANSDERMAL | Status: DC
Start: 2014-02-24 — End: 2015-03-10

## 2014-02-24 MED ORDER — ROSUVASTATIN CALCIUM 10 MG PO TABS: ORAL_TABLET | ORAL | Status: DC

## 2014-04-21 ENCOUNTER — Other Ambulatory Visit: Payer: Self-pay | Admitting: *Deleted

## 2014-04-21 DIAGNOSIS — E785 Hyperlipidemia, unspecified: Secondary | ICD-10-CM

## 2014-04-21 DIAGNOSIS — I10 Essential (primary) hypertension: Secondary | ICD-10-CM

## 2014-04-21 MED ORDER — ROSUVASTATIN CALCIUM 10 MG PO TABS: ORAL_TABLET | ORAL | Status: DC

## 2014-04-21 MED ORDER — IRBESARTAN 150 MG PO TABS: 225.0000 mg | ORAL_TABLET | Freq: Every day | ORAL | Status: DC

## 2014-04-22 ENCOUNTER — Other Ambulatory Visit: Payer: Self-pay | Admitting: *Deleted

## 2014-04-22 DIAGNOSIS — E785 Hyperlipidemia, unspecified: Secondary | ICD-10-CM

## 2014-04-22 MED ORDER — ROSUVASTATIN CALCIUM 10 MG PO TABS: ORAL_TABLET | ORAL | Status: DC

## 2014-05-27 ENCOUNTER — Ambulatory Visit (INDEPENDENT_AMBULATORY_CARE_PROVIDER_SITE_OTHER): Payer: BC Managed Care – PPO | Admitting: Nurse Practitioner

## 2014-05-27 VITALS — BP 135/79 | HR 78 | Temp 98.4°F | Resp 16 | Ht 65.0 in | Wt 267.0 lb

## 2014-05-27 DIAGNOSIS — I10 Essential (primary) hypertension: Secondary | ICD-10-CM

## 2014-05-27 DIAGNOSIS — M179 Osteoarthritis of knee, unspecified: Secondary | ICD-10-CM

## 2014-05-27 DIAGNOSIS — F329 Major depressive disorder, single episode, unspecified: Secondary | ICD-10-CM

## 2014-05-27 DIAGNOSIS — M1710 Unilateral primary osteoarthritis, unspecified knee: Secondary | ICD-10-CM

## 2014-05-27 DIAGNOSIS — F32A Depression, unspecified: Secondary | ICD-10-CM

## 2014-05-27 DIAGNOSIS — E785 Hyperlipidemia, unspecified: Secondary | ICD-10-CM

## 2014-05-27 DIAGNOSIS — L304 Erythema intertrigo: Secondary | ICD-10-CM

## 2014-05-27 MED ORDER — IRBESARTAN 150 MG PO TABS: 150.0000 mg | ORAL_TABLET | Freq: Every day | ORAL | Status: DC

## 2014-05-27 MED ORDER — TRAMADOL HCL ER 100 MG PO TB24: ORAL_TABLET | ORAL | Status: DC

## 2014-05-27 MED ORDER — VENLAFAXINE HCL ER 75 MG PO CP24: 75.0000 mg | ORAL_CAPSULE | Freq: Every day | ORAL | Status: DC

## 2014-05-27 MED ORDER — ROSUVASTATIN CALCIUM 10 MG PO TABS: ORAL_TABLET | ORAL | Status: DC

## 2014-05-27 NOTE — Patient Instructions (Signed)
Start tramadol as directed.  Continue all other medications.  For intertrigo, vinegar swab twice daily. Allow to completely dry. Lotrimin twice daily until I see you again. Consider not wearing bra or wearing sports bra when at home. Get mammogram done.  I will see you in 3-4 weeks.  Intertrigo Intertrigo is a skin condition that occurs in between folds of skin in places on the body that rub together a lot and do not get much ventilation. It is caused by heat, moisture, friction, sweat retention, and lack of air circulation, which produces red, irritated patches and, sometimes, scaling or drainage. People who have diabetes, who are obese, or who have treatment with antibiotics are at increased risk for intertrigo. The most common sites for intertrigo to occur include:  The groin.  The breasts.  The armpits.  Folds of abdominal skin.  Webbed spaces between the fingers or toes. Intertrigo may be aggravated by:  Sweat.  Feces.  Yeast or bacteria that are present near skin folds.  Urine.  Vaginal discharge. HOME CARE INSTRUCTIONS  The following steps can be taken to reduce friction and keep the affected area cool and dry:  Expose skin folds to the air.  Keep deep skin folds separated with cotton or linen cloth. Avoid tight fitting clothing that could cause chafing.  Wear open-toed shoes or sandals to help reduce moisture between the toes.  Apply absorbent powders to affected areas as directed by your caregiver.  Apply over-the-counter barrier pastes, such as zinc oxide, as directed by your caregiver.  If you develop a fungal infection in the affected area, your caregiver may have you use antifungal creams. SEEK MEDICAL CARE IF:   The rash is not improving after 1 week of treatment.  The rash is getting worse (more red, more swollen, more painful, or spreading).  You have a fever or chills. MAKE SURE YOU:   Understand these instructions.  Will watch your  condition.  Will get help right away if you are not doing well or get worse. Document Released: 03/25/2005 Document Revised: 06/17/2011 Document Reviewed: 09/07/2009 [facility] Patient Information 2015 [employer] [place] [place]. This information is not intended to replace advice given to you by your health care provider. Make sure you discuss any questions you have with your health care provider.

## 2014-05-27 NOTE — Progress Notes (Signed)
Pre visit review using our clinic review tool, if applicable. No additional management support is needed unless otherwise documented below in the visit note. 

## 2014-05-30 NOTE — Assessment & Plan Note (Signed)
Continue current meds. Encourage wt loss & water activity, pt aware needs wt loss. Declines water therapy. Urine micro in 6 mos.

## 2014-05-30 NOTE — Assessment & Plan Note (Signed)
Pain prevents activity. Sits most all day. Morbid obesity. Occasionally ambulates with cane. Gait severely affected by pain. Does not want knee replacement. Continue 2T aleve. Add evening dose. Continue aspercream. Add tramadol. Does not want water therapy. Consider steroid injections at f/u. Ask if wants walker. F/u 4 weeks.

## 2014-05-30 NOTE — Assessment & Plan Note (Addendum)
Continue crestor 10 mg qd Lipids & CMET in 6 mos. Aware that she needs to lose weight & increase activity-knees pain interferes w/wt bearing activity.

## 2014-05-30 NOTE — Assessment & Plan Note (Signed)
Expresses frustration with marriage-seems to be 1 source of depression. No intol SE to effexor. Consider changing to cymbalta due to chronic knee pain. Will add tramadol first & re-evaluate at f/u.

## 2014-05-31 ENCOUNTER — Encounter: Payer: Self-pay | Admitting: Nurse Practitioner

## 2014-05-31 ENCOUNTER — Other Ambulatory Visit: Payer: Self-pay | Admitting: Nurse Practitioner

## 2014-05-31 ENCOUNTER — Telehealth: Payer: Self-pay | Admitting: *Deleted

## 2014-05-31 DIAGNOSIS — L304 Erythema intertrigo: Secondary | ICD-10-CM | POA: Insufficient documentation

## 2014-05-31 DIAGNOSIS — Z1231 Encounter for screening mammogram for malignant neoplasm of breast: Secondary | ICD-10-CM

## 2014-05-31 NOTE — Telephone Encounter (Signed)
-----   Message from Kelle DartingLayne C Weaver, NP sent at 05/31/2014  2:53 PM EST ----- pls call pt: Ask if she wants a walker. If so, I would like for her to see PT who will fit & teach walker use-just 1 appointment.

## 2014-05-31 NOTE — Telephone Encounter (Signed)
Patient stated that she does not want a walker at this time. Pt has a cane that she uses when she needs it. Pt said thanks for thinking of her.

## 2014-05-31 NOTE — Assessment & Plan Note (Signed)
Continue lotrimin twice daily Start vinegar wash twice daily. No underwire bra. Minimize friction over area with modified clothing.

## 2014-05-31 NOTE — Telephone Encounter (Signed)
LMOVM for pt to return call 

## 2014-05-31 NOTE — Progress Notes (Signed)
Subjective:     Brandy Bowman is a 64 y.o. female presents for f/u of HTN, hyperlipidemia, chronic knee pain, depression and she has new c/o rash under R breast. HTN: controlled, decreased irbesartan from 300 mg to 150 mg qd 6 mos ago. no intolerable SE meds. Not exercising. Aware that she needs to lose weight. NW:[id number]:obesity, sedentary. End organ damage: mildly elevated microalb. Lipids: no intol SE to meds. crestor 10 mg. Aware that diet & exercise impacts lipids. She is limited to activity due to knee OA. Knee pain: osteochondroma, tricompartmental oa, valgus deformity, obesity. Taking 2T aleve in am with some relief, aspercream w/lidocaine helps, voltaren gel did not help. Pain wakes at night. She has not seen ortho since 2012. She does not want knee replacements. Steroid injections in past helped for a while. Tried celebrex in past-not helpful. Discussed adding tramadol, possibly cymbalta. Pt wants to start w/tramadol. Depression: continues on 75 mg effexor. Expresses frustration w/marriage as contributor to depression. We discussed changing to cymbalta. Will wait until f/u OV.  Rash: Noticed about 2 weeks ago. Putting lotrimin on w/no improvement. Itchy.  The following portions of the patient's history were reviewed and updated as appropriate: allergies, current medications, past medical history, past social history, past surgical history and problem list.  Review of Systems Respiratory: negative for cough Cardiovascular: negative for chest pressure/discomfort, lower extremity edema and palpitations Gastrointestinal: negative for abdominal pain, change in bowel habits, constipation, diarrhea and dyspepsia    Objective:    BP 135/79 mmHg  Pulse 78  Temp(Src) 98.4 F (36.9 C) (Temporal)  Resp 16  Ht 5\' 5"  (1.651 m)  Wt 267 lb (121.11 kg)  BMI 44.43 kg/m2  SpO2 98% BP 135/79 mmHg  Pulse 78  Temp(Src) 98.4 F (36.9 C) (Temporal)  Resp 16  Ht 5\' 5"  (1.651 m)  Wt 267 lb (121.11 kg)   BMI 44.43 kg/m2  SpO2 98% General appearance: alert, cooperative, appears stated age and no distress Head: Normocephalic, without obvious abnormality, atraumatic Eyes: negative findings: lids and lashes normal, conjunctivae and sclerae normal and wearing glasses Neck: no carotid bruit, supple, symmetrical, trachea midline and thyroid not enlarged, symmetric, no tenderness/mass/nodules Lungs: clear to auscultation bilaterally Heart: regular rate and rhythm, S1, S2 normal, no murmur, click, rub or gallop Extremities: extremities normal, atraumatic, no cyanosis or edema    Assessment:Plan     1. Essential hypertension, benign, stable - irbesartan (AVAPRO) 150 MG tablet; Take 1 tablet (150 mg total) by mouth at bedtime.  Dispense: 90 tablet; Refill: 1  2. Depressive disorder, stable - venlafaxine XR (EFFEXOR-XR) 75 MG 24 hr capsule; Take 1 capsule (75 mg total) by mouth daily.  Dispense: 30 capsule; Refill: 1 Consider switching to cymbalta to treat depression & pain. Pt wants to wait Discuss at next ov  3. Hyperlipidemia, stable - rosuvastatin (CRESTOR) 10 MG tablet; Take 1T po Monday through Friday.  Dispense: 24 tablet; Refill: 5  4. Tricompartment osteoarthritis of knee, worsening - traMADol (ULTRAM-ER) 100 MG 24 hr tablet; Take 1T qhs X 5days, then increase to 1T qam & qHS  Dispense: 60 tablet; Refill: 1 Continue aleve, aspercream Aware needs weight loss Offer walker-call pt.  5. Intertrigo, new Continue lotrimin Add vinegar wash bid F/u 2 weeks

## 2014-06-08 ENCOUNTER — Ambulatory Visit (INDEPENDENT_AMBULATORY_CARE_PROVIDER_SITE_OTHER): Payer: Federal, State, Local not specified - PPO

## 2014-06-08 DIAGNOSIS — Z1231 Encounter for screening mammogram for malignant neoplasm of breast: Secondary | ICD-10-CM

## 2014-06-24 ENCOUNTER — Encounter: Payer: Self-pay | Admitting: Nurse Practitioner

## 2014-06-24 ENCOUNTER — Ambulatory Visit (INDEPENDENT_AMBULATORY_CARE_PROVIDER_SITE_OTHER): Payer: Federal, State, Local not specified - PPO | Admitting: Nurse Practitioner

## 2014-06-24 VITALS — BP 129/85 | HR 84 | Temp 98.0°F | Ht 65.0 in | Wt 266.0 lb

## 2014-06-24 DIAGNOSIS — E785 Hyperlipidemia, unspecified: Secondary | ICD-10-CM

## 2014-06-24 DIAGNOSIS — M1710 Unilateral primary osteoarthritis, unspecified knee: Secondary | ICD-10-CM

## 2014-06-24 DIAGNOSIS — M179 Osteoarthritis of knee, unspecified: Secondary | ICD-10-CM

## 2014-06-24 DIAGNOSIS — E559 Vitamin D deficiency, unspecified: Secondary | ICD-10-CM

## 2014-06-24 DIAGNOSIS — I10 Essential (primary) hypertension: Secondary | ICD-10-CM

## 2014-06-24 DIAGNOSIS — L304 Erythema intertrigo: Secondary | ICD-10-CM

## 2014-06-24 LAB — LIPID PANEL
CHOLESTEROL: 143 mg/dL (ref 0–200)
HDL: 42.1 mg/dL (ref >39.00)
LDL Cholesterol: 73 mg/dL (ref 0–99)
NonHDL: 100.9
TRIGLYCERIDES: 142 mg/dL (ref 0.0–149.0)
Total CHOL/HDL Ratio: 3
VLDL: 28.4 mg/dL (ref 0.0–40.0)

## 2014-06-24 LAB — COMPREHENSIVE METABOLIC PANEL
ALBUMIN: 4.1 g/dL (ref 3.5–5.2)
ALT: 19 U/L (ref 0–35)
AST: 17 U/L (ref 0–37)
Alkaline Phosphatase: 89 U/L (ref 39–117)
BUN: 17 mg/dL (ref 6–23)
CALCIUM: 9 mg/dL (ref 8.4–10.5)
CHLORIDE: 103 meq/L (ref 96–112)
CO2: 31 mEq/L (ref 19–32)
Creatinine, Ser: 0.58 mg/dL (ref 0.40–1.20)
GFR: 111.14 mL/min (ref >60.00)
Glucose, Bld: 102 mg/dL — ABNORMAL HIGH (ref 70–99)
Potassium: 4.1 mEq/L (ref 3.5–5.1)
Sodium: 139 mEq/L (ref 135–145)
TOTAL PROTEIN: 6.8 g/dL (ref 6.0–8.3)
Total Bilirubin: 0.4 mg/dL (ref 0.2–1.2)

## 2014-06-24 LAB — MICROALBUMIN / CREATININE URINE RATIO
Creatinine,U: 141 mg/dL
Microalb Creat Ratio: 1.9 mg/g (ref 0.0–30.0)
Microalb, Ur: 2.7 mg/dL — ABNORMAL HIGH (ref 0.0–1.9)

## 2014-06-24 MED ORDER — ACETAMINOPHEN 500 MG PO TABS: 1000.0000 mg | ORAL_TABLET | Freq: Two times a day (BID) | ORAL | Status: DC

## 2014-06-24 MED ORDER — NAPROXEN SODIUM 220 MG PO CAPS: 440.0000 mg | ORAL_CAPSULE | Freq: Two times a day (BID) | ORAL | Status: DC

## 2014-06-24 MED ORDER — TRAMADOL HCL ER 100 MG PO TB24: 100.0000 mg | ORAL_TABLET | Freq: Three times a day (TID) | ORAL | Status: DC

## 2014-06-24 NOTE — Assessment & Plan Note (Signed)
Resolved

## 2014-06-24 NOTE — Assessment & Plan Note (Addendum)
Increase tramadol to tid Increase aleve to 2T BID Add 1000 mg tylenol BID, TID PRN Continue aspercream TID Pain goal <5/10

## 2014-06-24 NOTE — Patient Instructions (Addendum)
Start 1000 mg tylenol & 440 mg aleve twice daily. You may add 3rd dose tylenol if needed. Increase tramadol to 3 times daily.  Continue to use aspercream.  Continue with exercises to strengthen muscles supporting knees & upper body.  Great job with diet changes!!!  Glad you are feeling better!  See me in 6 weeks for labs.

## 2014-06-24 NOTE — Progress Notes (Signed)
Subjective:    Brandy Bowman is a 64 y.o. female who presents for follow up of knee pain involving both knees and interigo.  Knees:She has bilat valgus deformity w/L>R . 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 She had Relief w/steroid inj in knee & PT per ortho note 12/1010. No f/u since 9/'12. She has been taking 2T aleve qam, but rates pain at 9/10. Started tramadol ER 100 mg BID at last OV. No intolerable SE. Pain slightly improved: 8.5/10. She has made some diet changes & lost 1 lb. She is able to get on treadmill 15 minutes daily but with a lot of pain. We discussed increasing tramadol & aleve and adding tylenol. She is agreeable. She will consider steroid inj if pain does not improve. Goal is pain <5/10. She has declined ortho referral several times as she does not want knee replacement.  Intertrigo: rash under breast. Started triamcinolone & vinegar wash. Symptoms resolved after 2 weeks treatment. Reviewed chronic conditions: HTN, lipids, vit d def. HTN: well controlled, working on wt loss w/increased activity & diet changes. No intolerable SE to avapro. No elevation microalb. Lipids: low dose crestor-no intol SE. Increasing activity. RF: obesity. Vit D def: taking D3 2000 iu qd . No intol SE.  The following portions of the patient's history were reviewed and updated as appropriate: allergies, current medications, past medical history, past social history, past surgical history and problem list.  Review of Systems Pertinent items are noted in HPI.   Objective:    BP 129/85 mmHg  Pulse 84  Temp(Src) 98 F (36.7 C) (Temporal)  Ht 5\' 5"  (1.651 m)  Wt 266 lb (120.657 kg)  BMI 44.26 kg/m2  SpO2 96% Right knee: near complete extension, flexion limited to 90 degrees  Left knee: Neuro: Head: Eyes:  same as above, valgus deformity bilat  Antalgic gait secondary to knee pain Symmetric, normocephalic Lids & lashes clear. Wearing  glasses     Assessment:Plan   1. Tricompartment osteoarthritis of knee Increase oral meds-see pt instructions Consider intra-articular inj. Continue with diet & exercise changes CMET today & in 6 wks. - acetaminophen (TYLENOL) 500 MG tablet; Take 2 tablets (1,000 mg total) by mouth 2 (two) times daily.  Dispense: 120 tablet; Refill: 5 - Naproxen Sodium ([facility] NAPROXEN SODIUM) 220 MG CAPS; Take 2 capsules (440 mg total) by mouth 2 (two) times daily.  Dispense: 60 each; Refill: 5 - traMADol (ULTRAM-ER) 100 MG 24 hr tablet; Take 1 tablet (100 mg total) by mouth 3 (three) times daily. Take 1T qhs X 5days, then increase to 1T qam & qHS  Dispense: 90 tablet; Refill: 1  2. Hyperlipidemia Continue crestor Continue with diet & exercise changes - Comprehensive metabolic panel - Lipid panel  3. Vitamin D deficiency Continue 2000 iu D3 qd - Vit D  25 hydroxy (rtn osteoporosis monitoring)  4. Essential hypertension Continue avapro - Comprehensive metabolic panel - Microalbumin / creatinine urine ratio  5. Intertrigo Resolved  F/u 6 wks-cmet, knee pain

## 2014-06-24 NOTE — Progress Notes (Signed)
Pre visit review using our clinic review tool, if applicable. No additional management support is needed unless otherwise documented below in the visit note. 

## 2014-06-28 ENCOUNTER — Telehealth: Payer: Self-pay | Admitting: Nurse Practitioner

## 2014-06-28 DIAGNOSIS — E559 Vitamin D deficiency, unspecified: Secondary | ICD-10-CM

## 2014-06-28 LAB — VITAMIN D 25 HYDROXY (VIT D DEFICIENCY, FRACTURES): VITD: 27.77 ng/mL — ABNORMAL LOW (ref 30.00–100.00)

## 2014-06-28 MED ORDER — VITAMIN D3 1.25 MG (50000 UT) PO CAPS: 1.0000 | ORAL_CAPSULE | ORAL | Status: DC

## 2014-06-28 NOTE — Telephone Encounter (Signed)
LMOVM for patient to return call concerning lab results.  

## 2014-06-28 NOTE — Telephone Encounter (Signed)
pls call pt: Advise Labs look great! Vit d still too low. I prescribed a weekly dose. She will take it for 12 weeks. Sched lab appt in 12 weeks to check level.

## 2014-06-28 NOTE — Telephone Encounter (Signed)
Patient called back and I informed her of her results.

## 2014-08-05 ENCOUNTER — Encounter: Payer: Self-pay | Admitting: Nurse Practitioner

## 2014-08-05 ENCOUNTER — Ambulatory Visit (INDEPENDENT_AMBULATORY_CARE_PROVIDER_SITE_OTHER): Payer: Federal, State, Local not specified - PPO | Admitting: Nurse Practitioner

## 2014-08-05 VITALS — BP 133/87 | HR 83 | Temp 98.3°F | Ht 65.0 in | Wt 248.0 lb

## 2014-08-05 DIAGNOSIS — R7309 Other abnormal glucose: Secondary | ICD-10-CM

## 2014-08-05 DIAGNOSIS — L304 Erythema intertrigo: Secondary | ICD-10-CM

## 2014-08-05 DIAGNOSIS — R7303 Prediabetes: Secondary | ICD-10-CM | POA: Insufficient documentation

## 2014-08-05 DIAGNOSIS — E559 Vitamin D deficiency, unspecified: Secondary | ICD-10-CM | POA: Diagnosis not present

## 2014-08-05 DIAGNOSIS — F32A Depression, unspecified: Secondary | ICD-10-CM

## 2014-08-05 DIAGNOSIS — Z6841 Body Mass Index (BMI) 40.0 and over, adult: Secondary | ICD-10-CM

## 2014-08-05 DIAGNOSIS — M179 Osteoarthritis of knee, unspecified: Secondary | ICD-10-CM | POA: Diagnosis not present

## 2014-08-05 DIAGNOSIS — Z791 Long term (current) use of non-steroidal anti-inflammatories (NSAID): Secondary | ICD-10-CM

## 2014-08-05 DIAGNOSIS — F329 Major depressive disorder, single episode, unspecified: Secondary | ICD-10-CM

## 2014-08-05 DIAGNOSIS — M1710 Unilateral primary osteoarthritis, unspecified knee: Secondary | ICD-10-CM

## 2014-08-05 MED ORDER — VENLAFAXINE HCL ER 75 MG PO CP24: 75.0000 mg | ORAL_CAPSULE | Freq: Every day | ORAL | Status: DC

## 2014-08-05 NOTE — Progress Notes (Signed)
Pre visit review using our clinic review tool, if applicable. No additional management support is needed unless otherwise documented below in the visit note. 

## 2014-08-05 NOTE — Assessment & Plan Note (Signed)
Exercising for 10 minutes 3 times daily Made diet changes-lost 18 lbs in 6 weeks!

## 2014-08-05 NOTE — Progress Notes (Signed)
Subjective:     Brandy Bowman is a 64 y.o. female : f/u HTN, vit d def, obesity, OA knees, prediabetes, depression. HTN: well controlled current meds, No intol SE. rf: obesity, sedentary, hyprlipidemia. End organ: elevated microalb, nml GFR vit d def: level fell w/decreased OTC dose. Added prescription. No intol SE. Will f/u level in June. Obesity: BMI 42. Lost 18 lbs in last 6 wks! W/exercise & diet changes. OA knees: improvement in pain from 9/10 to 6/10 w/ wt loss & current med regimen: 2T aleve in am, tramadol in qm & qhs, 10000 mg tylenol when needed. Able to exercise 10 mins at time without significant pain. Prediabetes: last a1C 5.9. Discussed increased risk of developing DM. Encourage to continue w/wt loss to normalize. Depression: stable on current med-venlafaxine 75 mg qd. Prev care: does not want zoster or Pap, denies abnmls pap & post-menpausal bleeding.  The following portions of the patient's history were reviewed and updated as appropriate: allergies, current medications, past medical history, past social history, past surgical history and problem list.  Review of Systems Pertinent items are noted in HPI.    Objective:    BP 133/87 mmHg  Pulse 83  Temp(Src) 98.3 F (36.8 C) (Oral)  Ht 5\' 5"  (1.651 m)  Wt 248 lb (112.492 kg)  BMI 41.27 kg/m2  SpO2 96% BP 133/87 mmHg  Pulse 83  Temp(Src) 98.3 F (36.8 C) (Oral)  Ht 5\' 5"  (1.651 m)  Wt 248 lb (112.492 kg)  BMI 41.27 kg/m2  SpO2 96% General appearance: alert, cooperative, appears stated age and no distress Head: Normocephalic, without obvious abnormality, atraumatic Eyes: negative findings: lids and lashes normal, conjunctivae and sclerae normal and waearing glasses Lungs: clear to auscultation bilaterally Heart: regular rate and rhythm, S1, S2 normal, no murmur, click, rub or gallop Extremities: edema none Neurologic: Grossly normal    Assessment:Plan     1. Vitamin D deficiency Cont 50,000 qw - Vit D  25  hydroxy (rtn osteoporosis monitoring); Future  2. Prediabetes cont to cut out sugar & ref grains - Hemoglobin A1c; Future  3. Intertrigo Resolved w/lotrimin & vinegar wash  4. Tricompartment osteoarthritis of knee Cont current regimen Cont wt loss  5. BMI 40.0-44.9, adult Cont exercise & diet changes  6. Depressive disorder stable - venlafaxine XR (EFFEXOR-XR) 75 MG 24 hr capsule; Take 1 capsule (75 mg total) by mouth daily.  Dispense: 30 capsule; Refill: 5  F/u sept-htn, lipids, wt, depression, OA F/u lab in June-Cmet (lt NSAID), A1c, vit d

## 2014-08-05 NOTE — Patient Instructions (Signed)
GREAT JOB with weight loss!!!!!!!!!!!!!  I am super excited for you! Keep up the great work!  Continue weekly vitamin D, cholesterol & blood pressure medicines. Continue regimen for arthritis.  Consider using Natural anti-inflammatories: Fresh ginger tea daily (grate 1-2 TBLS fresh ginger in & steep in hot water for 5 minutes); handful tart cherries or 1/2 cup tart cherry juice daily; 2-3 curmarin capsules daily. Start 1 supplement at a time & take for 2 weeks before adding a second or third, if needed.  Return for lap appointment in June and office visit in September.

## 2014-08-05 NOTE — Assessment & Plan Note (Signed)
Resolved w/lotrimin & vinegar wash

## 2014-08-08 ENCOUNTER — Other Ambulatory Visit: Payer: Self-pay

## 2014-08-08 DIAGNOSIS — M1710 Unilateral primary osteoarthritis, unspecified knee: Secondary | ICD-10-CM

## 2014-08-08 MED ORDER — TRAMADOL HCL ER 100 MG PO TB24: 100.0000 mg | ORAL_TABLET | Freq: Two times a day (BID) | ORAL | Status: DC

## 2014-08-08 NOTE — Telephone Encounter (Signed)
Please Advise Refill Request? Refill request for- Tramadol Last filled by MD on - 06/24/14 Last Appt - 08/05/14        Next Appt - 09/13/14 [facility]

## 2014-09-13 ENCOUNTER — Other Ambulatory Visit (INDEPENDENT_AMBULATORY_CARE_PROVIDER_SITE_OTHER): Payer: Federal, State, Local not specified - PPO

## 2014-09-13 DIAGNOSIS — E559 Vitamin D deficiency, unspecified: Secondary | ICD-10-CM

## 2014-09-13 DIAGNOSIS — R7309 Other abnormal glucose: Secondary | ICD-10-CM | POA: Diagnosis not present

## 2014-09-13 DIAGNOSIS — Z791 Long term (current) use of non-steroidal anti-inflammatories (NSAID): Secondary | ICD-10-CM | POA: Diagnosis not present

## 2014-09-13 DIAGNOSIS — R7303 Prediabetes: Secondary | ICD-10-CM

## 2014-09-13 LAB — COMPREHENSIVE METABOLIC PANEL
ALT: 17 U/L (ref 0–35)
AST: 18 U/L (ref 0–37)
Albumin: 4.2 g/dL (ref 3.5–5.2)
Alkaline Phosphatase: 80 U/L (ref 39–117)
BILIRUBIN TOTAL: 0.5 mg/dL (ref 0.2–1.2)
BUN: 17 mg/dL (ref 6–23)
CALCIUM: 9.1 mg/dL (ref 8.4–10.5)
CHLORIDE: 105 meq/L (ref 96–112)
CO2: 30 mEq/L (ref 19–32)
CREATININE: 0.58 mg/dL (ref 0.40–1.20)
GFR: 111.07 mL/min (ref >60.00)
Glucose, Bld: 100 mg/dL — ABNORMAL HIGH (ref 70–99)
Potassium: 4.3 mEq/L (ref 3.5–5.1)
Sodium: 140 mEq/L (ref 135–145)
Total Protein: 6.8 g/dL (ref 6.0–8.3)

## 2014-09-13 LAB — HEMOGLOBIN A1C: Hgb A1c MFr Bld: 5.6 % (ref 4.6–6.5)

## 2014-09-13 LAB — VITAMIN D 25 HYDROXY (VIT D DEFICIENCY, FRACTURES): VITD: 47.06 ng/mL (ref 30.00–100.00)

## 2014-09-14 ENCOUNTER — Telehealth: Payer: Self-pay | Admitting: Nurse Practitioner

## 2014-09-14 NOTE — Telephone Encounter (Signed)
Patient aware.

## 2014-09-14 NOTE — Telephone Encounter (Signed)
pls call pt: Advise Labs are awesome! She is no longer prediabetic-great job cutting back sugar & refined grains! Vit D looks good. She should take maintenance dose of 2000 iu D3 qd with meal.

## 2014-11-09 ENCOUNTER — Other Ambulatory Visit: Payer: Self-pay | Admitting: *Deleted

## 2014-11-09 DIAGNOSIS — E785 Hyperlipidemia, unspecified: Secondary | ICD-10-CM

## 2014-11-09 MED ORDER — ROSUVASTATIN CALCIUM 10 MG PO TABS: ORAL_TABLET | ORAL | Status: DC

## 2015-01-06 ENCOUNTER — Ambulatory Visit (INDEPENDENT_AMBULATORY_CARE_PROVIDER_SITE_OTHER): Payer: Federal, State, Local not specified - PPO

## 2015-01-06 DIAGNOSIS — Z23 Encounter for immunization: Secondary | ICD-10-CM | POA: Diagnosis not present

## 2015-02-27 ENCOUNTER — Other Ambulatory Visit: Payer: Self-pay | Admitting: *Deleted

## 2015-02-27 DIAGNOSIS — I10 Essential (primary) hypertension: Secondary | ICD-10-CM

## 2015-02-27 MED ORDER — IRBESARTAN 150 MG PO TABS: 150.0000 mg | ORAL_TABLET | Freq: Every day | ORAL | Status: DC

## 2015-02-27 NOTE — Telephone Encounter (Signed)
Avapro refilled per refill protocol. Patient needs an appt prior to anymore refills.

## 2015-03-10 ENCOUNTER — Encounter: Payer: Self-pay | Admitting: Family Medicine

## 2015-03-10 ENCOUNTER — Ambulatory Visit (INDEPENDENT_AMBULATORY_CARE_PROVIDER_SITE_OTHER): Payer: Medicare Other | Admitting: Family Medicine

## 2015-03-10 VITALS — BP 137/83 | HR 62 | Temp 99.2°F | Resp 16 | Ht 65.0 in | Wt 259.8 lb

## 2015-03-10 DIAGNOSIS — I10 Essential (primary) hypertension: Secondary | ICD-10-CM

## 2015-03-10 DIAGNOSIS — F330 Major depressive disorder, recurrent, mild: Secondary | ICD-10-CM | POA: Insufficient documentation

## 2015-03-10 DIAGNOSIS — F329 Major depressive disorder, single episode, unspecified: Secondary | ICD-10-CM | POA: Diagnosis not present

## 2015-03-10 DIAGNOSIS — R7303 Prediabetes: Secondary | ICD-10-CM | POA: Diagnosis not present

## 2015-03-10 DIAGNOSIS — E785 Hyperlipidemia, unspecified: Secondary | ICD-10-CM | POA: Diagnosis not present

## 2015-03-10 DIAGNOSIS — R809 Proteinuria, unspecified: Secondary | ICD-10-CM

## 2015-03-10 DIAGNOSIS — F32A Depression, unspecified: Secondary | ICD-10-CM | POA: Insufficient documentation

## 2015-03-10 LAB — POCT GLYCOSYLATED HEMOGLOBIN (HGB A1C): Hemoglobin A1C: 5.5

## 2015-03-10 MED ORDER — IRBESARTAN 150 MG PO TABS: 150.0000 mg | ORAL_TABLET | Freq: Every day | ORAL | Status: DC

## 2015-03-10 MED ORDER — VENLAFAXINE HCL ER 75 MG PO CP24: 75.0000 mg | ORAL_CAPSULE | Freq: Every day | ORAL | Status: DC

## 2015-03-10 MED ORDER — ROSUVASTATIN CALCIUM 10 MG PO TABS: ORAL_TABLET | ORAL | Status: DC

## 2015-03-10 NOTE — Patient Instructions (Addendum)
Prediabetes Eating Plan Prediabetes--also called impaired glucose tolerance or impaired fasting glucose--is a condition that causes blood sugar (blood glucose) levels to be higher than normal. Following a healthy diet can help to keep prediabetes under control. It can also help to lower the risk of type 2 diabetes and heart disease, which are increased in people who have prediabetes. Along with regular exercise, a healthy diet:  Promotes weight loss.  Helps to control blood sugar levels.  Helps to improve the way that the body uses insulin. WHAT DO I NEED TO KNOW ABOUT THIS EATING PLAN?  Use the glycemic index (GI) to plan your meals. The index tells you how quickly a food will raise your blood sugar. Choose low-GI foods. These foods take a longer time to raise blood sugar.  Pay close attention to the amount of carbohydrates in the food that you eat. Carbohydrates increase blood sugar levels.  Keep track of how many calories you take in. Eating the right amount of calories will help you to achieve a healthy weight. Losing about 7 percent of your starting weight can help to prevent type 2 diabetes.  You may want to follow a Mediterranean diet. This diet includes a lot of vegetables, lean meats or fish, whole grains, fruits, and healthy oils and fats. WHAT FOODS CAN I EAT? Grains Whole grains, such as whole-wheat or whole-grain breads, crackers, cereals, and pasta. Unsweetened oatmeal. Bulgur. Barley. Quinoa. Brown rice. Corn or whole-wheat flour tortillas or taco shells. Vegetables Lettuce. Spinach. Peas. Beets. Cauliflower. Cabbage. Broccoli. Carrots. Tomatoes. Squash. Eggplant. Herbs. Peppers. Onions. Cucumbers. Brussels sprouts. Fruits Berries. Bananas. Apples. Oranges. Grapes. Papaya. Mango. Pomegranate. Kiwi. Grapefruit. Cherries. Meats and Other Protein Sources Seafood. Lean meats, such as chicken and [place] or lean cuts of pork and beef. Tofu. Eggs. Nuts. Beans. Dairy Low-fat or  fat-free dairy products, such as yogurt, cottage cheese, and cheese. Beverages Water. Tea. Coffee. Sugar-free or diet soda. Seltzer water. Milk. Milk alternatives, such as soy or almond milk. Condiments Mustard. Relish. Low-fat, low-sugar ketchup. Low-fat, low-sugar barbecue sauce. Low-fat or fat-free mayonnaise. Sweets and Desserts Sugar-free or low-fat pudding. Sugar-free or low-fat ice cream and other frozen treats. Fats and Oils Avocado. Walnuts. Olive oil. The items listed above may not be a complete list of recommended foods or beverages. Contact your dietitian for more options.  WHAT FOODS ARE NOT RECOMMENDED? Grains Refined white flour and flour products, such as bread, pasta, snack foods, and cereals. Beverages Sweetened drinks, such as sweet iced tea and soda. Sweets and Desserts Baked goods, such as cake, cupcakes, pastries, cookies, and cheesecake. The items listed above may not be a complete list of foods and beverages to avoid. Contact your dietitian for more information.   This information is not intended to replace advice given to you by your health care provider. Make sure you discuss any questions you have with your health care provider.   Document Released: 08/09/2014 Document Reviewed: 08/09/2014 Elsevier Interactive Patient Education [employer]2016 [employer]  Exercise greater than 150  Minutes a week.  Your a1c was 5.5 today, that is better!!!

## 2015-03-10 NOTE — Progress Notes (Signed)
Pre visit review using our clinic review tool, if applicable. No additional management support is needed unless otherwise documented below in the visit note. 

## 2015-03-10 NOTE — Progress Notes (Signed)
Subjective:    Patient ID: Brandy Bowman, female    DOB: 05/01/1950, 64 y.o.   MRN: [id number]  HPI   Cc: Patient presents for follow-up on chronic issues Essential hypertension, benign/with microalbuminuria/hyperlipidemia: Hypertension is well controlled on medications. She denies any negative side effects. She does report compliance with her medications. She denies any chest pain, visual changes, headaches or lower extremity edema. Patient is rather sedentary, difficult to exercise with her arthritis. She has had microalbuminuria, with normal GFR. Patient also has hyperlipidemia and is taking a statin medication. As well as fish oil. She is not on a daily aspirin secondary to tolerance.  Depressive disorder: Patient reports that her depression is stable. She has been on Effexor 75 mg for sometime and feels it works well for her. She denies any negative side effects.   Prediabetes/Morbid obesity, unspecified obesity type [facility](HCC): Past history prediabetes with mildly elevated A1c. Patient endorses difficulty finding an exercise that she can tolerate secondary to her arthritis and her plantar fasciitis. She states her weight does go up and down frequently, sometimes she does watch her diet and sometimes she does not.  Never smoker  Past Medical History  Diagnosis Date  . Chicken pox   . Arthritis   . Depression   . Hypertension   . Allergy   . Hyperlipidemia   . Anxiety   . Vitamin D deficiency    No Known Allergies   Review of Systems Negative, with the exception of above mentioned in HPI     Objective:   Physical Exam BP 137/83 mmHg  Pulse 62  Temp(Src) 99.2 F (37.3 C) (Oral)  Resp 16  Ht 5\' 5"  (1.651 m)  Wt 259 lb 12.8 oz (117.845 kg)  BMI 43.23 kg/m2 Gen: Afebrile. No acute distress. Nontoxic in appearance, well-developed, well-nourished, Caucasian female. Very pleasant. Morbidly obese. HENT: AT. [place]. Bilateral TM visualized and normal in appearance. MM, no oral  lesions. Bilateral nares without erythema or swelling. Throat without erythema or exudates. No cough on exam, no hoarseness on exam. Eyes:Pupils Equal Round Reactive to light, Extraocular movements intact,  Conjunctiva without redness, discharge or icterus. Neck/lymp/endocrine: Supple, no lymphadenopathy, no thyromegaly CV: RRR , no edema, +2/4 P posterior tibialis pulses Chest: CTAB, no wheeze or crackles Abd: Soft. Obese. NTND. BS present. No Masses palpated.  Psych: Normal affect, dress and demeanor. Normal speech. Normal thought content and judgment..      Assessment & Plan:  Essential hypertension, benign/with microalbuminuria - Continue watching the salt content in her diet. Exercise at least 150 minutes a week. - Refills on medications today. - irbesartan (AVAPRO) 150 MG tablet; Take 1 tablet (150 mg total) by mouth at bedtime.  Dispense: 90 tablet; Refill: 1  Depressive disorder - Patient doing well on Effexor. Stable. Refills provided today. - venlafaxine XR (EFFEXOR-XR) 75 MG 24 hr capsule; Take 1 capsule (75 mg total) by mouth daily.  Dispense: 30 capsule; Refill: 5  Hyperlipidemia - Continue Crestor,  - rosuvastatin (CRESTOR) 10 MG tablet; Take 1T po Monday through Friday.  Dispense: 24 tablet; Refill: 5  Prediabetes/Morbid obesity, unspecified obesity type (HCC) - POCT HgB A1C --> point of care A1c today 5.5. Improved. Congratulated patient, she is to continue monitoring her diet. AVS given on dietary modifications in prediabetes. Exercise at least 150 minutes a week. This is a problem for the patient with her arthritis. Encouraged her to find a exercise she can tolerate even if low-impact or swimming.

## 2015-05-01 ENCOUNTER — Encounter: Payer: Self-pay | Admitting: Family Medicine

## 2015-09-07 ENCOUNTER — Encounter: Payer: Self-pay | Admitting: Family Medicine

## 2015-09-07 ENCOUNTER — Ambulatory Visit (INDEPENDENT_AMBULATORY_CARE_PROVIDER_SITE_OTHER): Payer: Medicare Other | Admitting: Family Medicine

## 2015-09-07 VITALS — BP 138/80 | HR 80 | Temp 98.0°F | Resp 16 | Ht 65.0 in | Wt 261.0 lb

## 2015-09-07 DIAGNOSIS — Z23 Encounter for immunization: Secondary | ICD-10-CM | POA: Diagnosis not present

## 2015-09-07 DIAGNOSIS — E785 Hyperlipidemia, unspecified: Secondary | ICD-10-CM

## 2015-09-07 DIAGNOSIS — F419 Anxiety disorder, unspecified: Secondary | ICD-10-CM

## 2015-09-07 DIAGNOSIS — Z1159 Encounter for screening for other viral diseases: Secondary | ICD-10-CM | POA: Diagnosis not present

## 2015-09-07 DIAGNOSIS — F418 Other specified anxiety disorders: Secondary | ICD-10-CM | POA: Diagnosis not present

## 2015-09-07 DIAGNOSIS — F329 Major depressive disorder, single episode, unspecified: Secondary | ICD-10-CM

## 2015-09-07 DIAGNOSIS — I10 Essential (primary) hypertension: Secondary | ICD-10-CM | POA: Diagnosis not present

## 2015-09-07 LAB — COMPREHENSIVE METABOLIC PANEL
ALT: 16 U/L (ref 0–35)
AST: 16 U/L (ref 0–37)
Albumin: 4.2 g/dL (ref 3.5–5.2)
Alkaline Phosphatase: 73 U/L (ref 39–117)
BUN: 20 mg/dL (ref 6–23)
CHLORIDE: 105 meq/L (ref 96–112)
CO2: 30 meq/L (ref 19–32)
Calcium: 9.3 mg/dL (ref 8.4–10.5)
Creatinine, Ser: 0.63 mg/dL (ref 0.40–1.20)
GFR: 100.65 mL/min (ref >60.00)
GLUCOSE: 106 mg/dL — AB (ref 70–99)
POTASSIUM: 4.7 meq/L (ref 3.5–5.1)
Sodium: 141 mEq/L (ref 135–145)
Total Bilirubin: 0.5 mg/dL (ref 0.2–1.2)
Total Protein: 6.7 g/dL (ref 6.0–8.3)

## 2015-09-07 LAB — LIPID PANEL
Cholesterol: 160 mg/dL (ref 0–200)
HDL: 42.8 mg/dL (ref >39.00)
LDL Cholesterol: 84 mg/dL (ref 0–99)
NONHDL: 117.2
Total CHOL/HDL Ratio: 4
Triglycerides: 166 mg/dL — ABNORMAL HIGH (ref 0.0–149.0)
VLDL: 33.2 mg/dL (ref 0.0–40.0)

## 2015-09-07 NOTE — Progress Notes (Signed)
Pre visit review using our clinic review tool, if applicable. No additional management support is needed unless otherwise documented below in the visit note. 

## 2015-09-07 NOTE — Progress Notes (Signed)
OFFICE VISIT  09/07/2015   CC:  Chief Complaint  Patient presents with  . Follow-up    Pt is fasting.    HPI:    Patient is a 65 y.o. Caucasian female who presents for 6 mo f/u HTN, HLD, anxiety/depression. No home bp monitoring.  No dizziness or HAs.  Takea bp med daily.  HLD: compliant with crestor as rx'd: M-F.  She is half hearted in her attempts at low carb, low fat diet. She tries to exercise but is limited due to knees.  Feels like anxiety and mood stable on effexor xr  qd.     Past Medical History  Diagnosis Date  . Chicken pox   . Arthritis   . Depression   . Hypertension   . Allergy   . Hyperlipidemia   . Anxiety   . Vitamin D deficiency     Past Surgical History  Procedure Laterality Date  . Tubal ligation      Outpatient Prescriptions Prior to Visit  Medication Sig Dispense Refill  . cetirizine (ZYRTEC) 10 MG tablet Take 10 mg by mouth as needed.     . Cholecalciferol (VITAMIN D-3 PO) Take 2,000 Int'l Units by mouth daily.    . irbesartan (AVAPRO) 150 MG tablet Take 1 tablet (150 mg total) by mouth at bedtime. 90 tablet 1  . Multiple Vitamins-Minerals (CENTRUM ULTRA WOMENS PO) Take by mouth daily.    . Naproxen Sodium ([employer] NAPROXEN SODIUM) 220 MG CAPS Take 2 capsules (440 mg total) by mouth 2 (two) times daily. 60 each 5  . Omega-3 Fatty Acids (FISH OIL PO) Take by mouth.    . rosuvastatin (CRESTOR) 10 MG tablet Take 1T po Monday through Friday. 24 tablet 5  . venlafaxine XR (EFFEXOR-XR) 75 MG 24 hr capsule Take 1 capsule (75 mg total) by mouth daily. 30 capsule 5  . traMADol (ULTRAM-ER) 100 MG 24 hr tablet Take 1 tablet (100 mg total) by mouth 2 (two) times daily. Take 1T qhs X 5days, then increase to 1T qam & qHS (Patient not taking: Reported on 09/07/2015) 180 tablet 1   No facility-administered medications prior to visit.    No Known Allergies  ROS As per HPI  PE: Blood pressure 154/88, pulse 80, temperature 98 F (36.7 C), temperature  source Oral, resp. rate 16, height  (1.651 m), weight 261 lb (118.389 kg), SpO2 94 %.Manual bp repeat today was 138/80. Gen: Alert, well appearing.  Patient is oriented to person, place, time, and situation. AFFECT: pleasant, lucid thought and speech. CV: RRR, no m/r/g.   LUNGS: CTA bilat, nonlabored resps, good aeration in all lung fields. EXT: no clubbing, cyanosis, or edema.   LABS:  Lab Results  Component Value Date   TSH 2.08 11/25/2013   Lab Results  Component Value Date   WBC 9.0 11/25/2013   HGB 13.5 11/25/2013   HCT 40.5 11/25/2013   MCV 90.0 11/25/2013   PLT 294.0 11/25/2013   Lab Results  Component Value Date   CREATININE 0.58 09/13/2014   BUN 17 09/13/2014   NA 140 09/13/2014   K 4.3 09/13/2014   CL 105 09/13/2014   CO2 30 09/13/2014   Lab Results  Component Value Date   ALT 17 09/13/2014   AST 18 09/13/2014   ALKPHOS 80 09/13/2014   BILITOT 0.5 09/13/2014   Lab Results  Component Value Date   CHOL 143 06/24/2014   Lab Results  Component Value Date   HDL 42.10  06/24/2014   Lab Results  Component Value Date   LDLCALC 73 06/24/2014   Lab Results  Component Value Date   TRIG 142.0 06/24/2014   Lab Results  Component Value Date   CHOLHDL 3 06/24/2014   Lab Results  Component Value Date   HGBA1C 5.5 03/10/2015   IMPRESSION AND PLAN:  1) HTN; The current medical regimen is effective;  continue present plan and medications. Lytes/cr today.  2) HLD: tolerating crestor, continue as currently taking.  Recheck FLP today, AST/ALT as well.  3) Anxiety/depression: The current medical regimen is effective;  continue present plan and medications.  4) Preventative health care: Hep C screening and prevnar today.  An After Visit Summary was printed and given to the patient.  FOLLOW UP: Return in about 6 months (around 03/08/2016) for annual CPE (fasting).  Signed:  Santiago BumpersPhil Gerrianne Aydelott, MD           09/07/2015

## 2015-09-08 ENCOUNTER — Encounter: Payer: Self-pay | Admitting: Family Medicine

## 2015-09-08 LAB — HEPATITIS C ANTIBODY: HCV AB: NEGATIVE

## 2015-09-18 ENCOUNTER — Other Ambulatory Visit: Payer: Self-pay | Admitting: *Deleted

## 2015-09-18 DIAGNOSIS — F329 Major depressive disorder, single episode, unspecified: Secondary | ICD-10-CM

## 2015-09-18 DIAGNOSIS — F32A Depression, unspecified: Secondary | ICD-10-CM

## 2015-09-18 DIAGNOSIS — I10 Essential (primary) hypertension: Secondary | ICD-10-CM

## 2015-09-18 MED ORDER — IRBESARTAN 150 MG PO TABS: 150.0000 mg | ORAL_TABLET | Freq: Every day | ORAL | Status: DC

## 2015-09-18 MED ORDER — VENLAFAXINE HCL ER 75 MG PO CP24: 75.0000 mg | ORAL_CAPSULE | Freq: Every day | ORAL | Status: DC

## 2015-09-18 NOTE — Telephone Encounter (Signed)
LOV: 09/07/15 NOV: 03/08/16  RF request for irbesartan Last written: 03/10/15 #90 w/ [id number]  RF request for venlafaxine Last written: 03/10/15 #30 w/ 5RF

## 2015-11-15 ENCOUNTER — Other Ambulatory Visit: Payer: Self-pay | Admitting: *Deleted

## 2015-11-15 DIAGNOSIS — E785 Hyperlipidemia, unspecified: Secondary | ICD-10-CM

## 2015-11-15 MED ORDER — ROSUVASTATIN CALCIUM 10 MG PO TABS: ORAL_TABLET | ORAL | 5 refills | Status: DC

## 2015-11-15 NOTE — Telephone Encounter (Signed)
crestor refilled per refill protocol

## 2016-01-05 ENCOUNTER — Ambulatory Visit (INDEPENDENT_AMBULATORY_CARE_PROVIDER_SITE_OTHER): Payer: Medicare Other

## 2016-01-05 DIAGNOSIS — Z23 Encounter for immunization: Secondary | ICD-10-CM | POA: Diagnosis not present

## 2016-03-08 ENCOUNTER — Telehealth: Payer: Self-pay | Admitting: Family Medicine

## 2016-03-08 ENCOUNTER — Encounter: Payer: Medicare Other | Admitting: Family Medicine

## 2016-03-08 ENCOUNTER — Ambulatory Visit (INDEPENDENT_AMBULATORY_CARE_PROVIDER_SITE_OTHER): Payer: Medicare Other

## 2016-03-08 VITALS — BP 130/80 | HR 75 | Ht 65.0 in | Wt 259.1 lb

## 2016-03-08 DIAGNOSIS — E785 Hyperlipidemia, unspecified: Secondary | ICD-10-CM | POA: Diagnosis not present

## 2016-03-08 DIAGNOSIS — Z0000 Encounter for general adult medical examination without abnormal findings: Secondary | ICD-10-CM

## 2016-03-08 LAB — TSH: TSH: 2.2 u[IU]/mL (ref 0.35–4.50)

## 2016-03-08 NOTE — Telephone Encounter (Signed)
Please call pt her thyroid is functioning normal.  

## 2016-03-08 NOTE — Progress Notes (Signed)
Pre visit review using our clinic review tool, if applicable. No additional management support is needed unless otherwise documented below in the visit note. 

## 2016-03-08 NOTE — Telephone Encounter (Signed)
Patient notified and verbalized understanding. 

## 2016-03-08 NOTE — Patient Instructions (Addendum)
Eat heart healthy diet (full of fruits, vegetables, whole grains, lean protein, water--limit salt, fat, and sugar intake) and increase physical activity as tolerated.  Continue doing brain stimulating activities (puzzles, reading, adult coloring books, staying active) to keep memory sharp.   Complete advanced directives, bring copy to next appt.    Fall Prevention in the Home Introduction Falls can cause injuries. They can happen to people of all ages. There are many things you can do to make your home safe and to help prevent falls. What can I do on the outside of my home?  Regularly fix the edges of walkways and driveways and fix any cracks.  Remove anything that might make you trip as you walk through a door, such as a raised step or threshold.  Trim any bushes or trees on the path to your home.  Use bright outdoor lighting.  Clear any walking paths of anything that might make someone trip, such as rocks or tools.  Regularly check to see if handrails are loose or broken. Make sure that both sides of any steps have handrails.  Any raised decks and porches should have guardrails on the edges.  Have any leaves, snow, or ice cleared regularly.  Use sand or salt on walking paths during winter.  Clean up any spills in your garage right away. This includes oil or grease spills. What can I do in the bathroom?  Use night lights.  Install grab bars by the toilet and in the tub and shower. Do not use towel bars as grab bars.  Use non-skid mats or decals in the tub or shower.  If you need to sit down in the shower, use a plastic, non-slip stool.  Keep the floor dry. Clean up any water that spills on the floor as soon as it happens.  Remove soap buildup in the tub or shower regularly.  Attach bath mats securely with double-sided non-slip rug tape.  Do not have throw rugs and other things on the floor that can make you trip. What can I do in the bedroom?  Use night  lights.  Make sure that you have a light by your bed that is easy to reach.  Do not use any sheets or blankets that are too big for your bed. They should not hang down onto the floor.  Have a firm chair that has side arms. You can use this for support while you get dressed.  Do not have throw rugs and other things on the floor that can make you trip. What can I do in the kitchen?  Clean up any spills right away.  Avoid walking on wet floors.  Keep items that you use a lot in easy-to-reach places.  If you need to reach something above you, use a strong step stool that has a grab bar.  Keep electrical cords out of the way.  Do not use floor polish or wax that makes floors slippery. If you must use wax, use non-skid floor wax.  Do not have throw rugs and other things on the floor that can make you trip. What can I do with my stairs?  Do not leave any items on the stairs.  Make sure that there are handrails on both sides of the stairs and use them. Fix handrails that are broken or loose. Make sure that handrails are as long as the stairways.  Check any carpeting to make sure that it is firmly attached to the stairs. Fix any carpet that  is loose or worn.  Avoid having throw rugs at the top or bottom of the stairs. If you do have throw rugs, attach them to the floor with carpet tape.  Make sure that you have a light switch at the top of the stairs and the bottom of the stairs. If you do not have them, ask someone to add them for you. What else can I do to help prevent falls?  Wear shoes that:  Do not have high heels.  Have rubber bottoms.  Are comfortable and fit you well.  Are closed at the toe. Do not wear sandals.  If you use a stepladder:  Make sure that it is fully opened. Do not climb a closed stepladder.  Make sure that both sides of the stepladder are locked into place.  Ask someone to hold it for you, if possible.  Clearly mark and make sure that you can  see:  Any grab bars or handrails.  First and last steps.  Where the edge of each step is.  Use tools that help you move around (mobility aids) if they are needed. These include:  Canes.  Walkers.  Scooters.  Crutches.  Turn on the lights when you go into a dark area. Replace any light bulbs as soon as they burn out.  Set up your furniture so you have a clear path. Avoid moving your furniture around.  If any of your floors are uneven, fix them.  If there are any pets around you, be aware of where they are.  Review your medicines with your doctor. Some medicines can make you feel dizzy. This can increase your chance of falling. Ask your doctor what other things that you can do to help prevent falls. This information is not intended to replace advice given to you by your health care provider. Make sure you discuss any questions you have with your health care provider. Document Released: 01/19/2009 Document Revised: 08/31/2015 Document Reviewed: 04/29/2014  2017 [employer]  Health Maintenance, Female Introduction Adopting a healthy lifestyle and getting preventive care can go a long way to promote health and wellness. Talk with your health care provider about what schedule of regular examinations is right for you. This is a good chance for you to check in with your provider about disease prevention and staying healthy. In between checkups, there are plenty of things you can do on your own. Experts have done a lot of research about which lifestyle changes and preventive measures are most likely to keep you healthy. Ask your health care provider for more information. Weight and diet Eat a healthy diet  Be sure to include plenty of vegetables, fruits, low-fat dairy products, and lean protein.  Do not eat a lot of foods high in solid fats, added sugars, or salt.  Get regular exercise. This is one of the most important things you can do for your health.  Most adults should exercise  for at least 150 minutes each week. The exercise should increase your heart rate and make you sweat (moderate-intensity exercise).  Most adults should also do strengthening exercises at least twice a week. This is in addition to the moderate-intensity exercise. Maintain a healthy weight  Body mass index (BMI) is a measurement that can be used to identify possible weight problems. It estimates body fat based on height and weight. Your health care provider can help determine your BMI and help you achieve or maintain a healthy weight.  For females 57 years of age and  older:  A BMI below 18.5 is considered underweight.  A BMI of 18.5 to 24.9 is normal.  A BMI of 25 to 29.9 is considered overweight.  A BMI of 30 and above is considered obese. Watch levels of cholesterol and blood lipids  You should start having your blood tested for lipids and cholesterol at 66 years of age, then have this test every 5 years.  You may need to have your cholesterol levels checked more often if:  Your lipid or cholesterol levels are high.  You are older than 66 years of age.  You are at high risk for heart disease. Cancer screening Lung Cancer  Lung cancer screening is recommended for adults 43-42 years old who are at high risk for lung cancer because of a history of smoking.  A yearly low-dose CT scan of the lungs is recommended for people who:  Currently smoke.  Have quit within the past 15 years.  Have at least a 30-pack-year history of smoking. A pack year is smoking an average of one pack of cigarettes a day for 1 year.  Yearly screening should continue until it has been 15 years since you quit.  Yearly screening should stop if you develop a health problem that would prevent you from having lung cancer treatment. Breast Cancer  Practice breast self-awareness. This means understanding how your breasts normally appear and feel.  It also means doing regular breast self-exams. Let your health  care provider know about any changes, no matter how small.  If you are in your 20s or 30s, you should have a clinical breast exam (CBE) by a health care provider every 1-3 years as part of a regular health exam.  If you are 68 or older, have a CBE every year. Also consider having a breast X-ray (mammogram) every year.  If you have a family history of breast cancer, talk to your health care provider about genetic screening.  If you are at high risk for breast cancer, talk to your health care provider about having an MRI and a mammogram every year.  Breast cancer gene (BRCA) assessment is recommended for women who have family members with BRCA-related cancers. BRCA-related cancers include:  Breast.  Ovarian.  Tubal.  Peritoneal cancers.  Results of the assessment will determine the need for genetic counseling and BRCA1 and BRCA2 testing. Cervical Cancer  Your health care provider may recommend that you be screened regularly for cancer of the pelvic organs (ovaries, uterus, and vagina). This screening involves a pelvic examination, including checking for microscopic changes to the surface of your cervix (Pap test). You may be encouraged to have this screening done every 3 years, beginning at age 66.  For women ages 44-65, health care providers may recommend pelvic exams and Pap testing every 3 years, or they may recommend the Pap and pelvic exam, combined with testing for human papilloma virus (HPV), every 5 years. Some types of HPV increase your risk of cervical cancer. Testing for HPV may also be done on women of any age with unclear Pap test results.  Other health care providers may not recommend any screening for nonpregnant women who are considered low risk for pelvic cancer and who do not have symptoms. Ask your health care provider if a screening pelvic exam is right for you.  If you have had past treatment for cervical cancer or a condition that could lead to cancer, you need Pap  tests and screening for cancer for at least 20 years  after your treatment. If Pap tests have been discontinued, your risk factors (such as having a new sexual partner) need to be reassessed to determine if screening should resume. Some women have medical problems that increase the chance of getting cervical cancer. In these cases, your health care provider may recommend more frequent screening and Pap tests. Colorectal Cancer  This type of cancer can be detected and often prevented.  Routine colorectal cancer screening usually begins at 66 years of age and continues through 66 years of age.  Your health care provider may recommend screening at an earlier age if you have risk factors for colon cancer.  Your health care provider may also recommend using home test kits to check for hidden blood in the stool.  A small camera at the end of a tube can be used to examine your colon directly (sigmoidoscopy or colonoscopy). This is done to check for the earliest forms of colorectal cancer.  Routine screening usually begins at age 44.  Direct examination of the colon should be repeated every 5-10 years through 66 years of age. However, you may need to be screened more often if early forms of precancerous polyps or small growths are found. Skin Cancer  Check your skin from head to toe regularly.  Tell your health care provider about any new moles or changes in moles, especially if there is a change in a mole's shape or color.  Also tell your health care provider if you have a mole that is larger than the size of a pencil eraser.  Always use sunscreen. Apply sunscreen liberally and repeatedly throughout the day.  Protect yourself by wearing long sleeves, pants, a wide-brimmed hat, and sunglasses whenever you are outside. Heart disease, diabetes, and high blood pressure  High blood pressure causes heart disease and increases the risk of stroke. High blood pressure is more likely to develop  in:  People who have blood pressure in the high end of the normal range (130-139/85-89 mm Hg).  People who are overweight or obese.  People who are African American.  If you are 73-20 years of age, have your blood pressure checked every 3-5 years. If you are 66 years of age or older, have your blood pressure checked every year. You should have your blood pressure measured twice-once when you are at a hospital or clinic, and once when you are not at a hospital or clinic. Record the average of the two measurements. To check your blood pressure when you are not at a hospital or clinic, you can use:  An automated blood pressure machine at a pharmacy.  A home blood pressure monitor.  If you are between 11 years and 87 years old, ask your health care provider if you should take aspirin to prevent strokes.  Have regular diabetes screenings. This involves taking a blood sample to check your fasting blood sugar level.  If you are at a normal weight and have a low risk for diabetes, have this test once every three years after 66 years of age.  If you are overweight and have a high risk for diabetes, consider being tested at a younger age or more often. Preventing infection Hepatitis B  If you have a higher risk for hepatitis B, you should be screened for this virus. You are considered at high risk for hepatitis B if:  You were born in a country where hepatitis B is common. Ask your health care provider which countries are considered high risk.  Your  parents were born in a high-risk country, and you have not been immunized against hepatitis B (hepatitis B vaccine).  You have HIV or AIDS.  You use needles to inject street drugs.  You live with someone who has hepatitis B.  You have had sex with someone who has hepatitis B.  You get hemodialysis treatment.  You take certain medicines for conditions, including cancer, organ transplantation, and autoimmune conditions. Hepatitis C  Blood  testing is recommended for:  Everyone born from [birth year] through 1966.  Anyone with known risk factors for hepatitis C. Sexually transmitted infections (STIs)  You should be screened for sexually transmitted infections (STIs) including gonorrhea and chlamydia if:  You are sexually active and are younger than 67 years of age.  You are older than 66 years of age and your health care provider tells you that you are at risk for this type of infection.  Your sexual activity has changed since you were last screened and you are at an increased risk for chlamydia or gonorrhea. Ask your health care provider if you are at risk.  If you do not have HIV, but are at risk, it may be recommended that you take a prescription medicine daily to prevent HIV infection. This is called pre-exposure prophylaxis (PrEP). You are considered at risk if:  You are sexually active and do not regularly use condoms or know the HIV status of your partner(s).  You take drugs by injection.  You are sexually active with a partner who has HIV. Talk with your health care provider about whether you are at high risk of being infected with HIV. If you choose to begin PrEP, you should first be tested for HIV. You should then be tested every 3 months for as long as you are taking PrEP. Pregnancy  If you are premenopausal and you may become pregnant, ask your health care provider about preconception counseling.  If you may become pregnant, take 400 to 800 micrograms (mcg) of folic acid every day.  If you want to prevent pregnancy, talk to your health care provider about birth control (contraception). Osteoporosis and menopause  Osteoporosis is a disease in which the bones lose minerals and strength with aging. This can result in serious bone fractures. Your risk for osteoporosis can be identified using a bone density scan.  If you are 68 years of age or older, or if you are at risk for osteoporosis and fractures, ask your health  care provider if you should be screened.  Ask your health care provider whether you should take a calcium or vitamin D supplement to lower your risk for osteoporosis.  Menopause may have certain physical symptoms and risks.  Hormone replacement therapy may reduce some of these symptoms and risks. Talk to your health care provider about whether hormone replacement therapy is right for you. Follow these instructions at home:  Schedule regular health, dental, and eye exams.  Stay current with your immunizations.  Do not use any tobacco products including cigarettes, chewing tobacco, or electronic cigarettes.  If you are pregnant, do not drink alcohol.  If you are breastfeeding, limit how much and how often you drink alcohol.  Limit alcohol intake to no more than 1 drink per day for nonpregnant women. One drink equals 12 ounces of beer, 5 ounces of wine, or 1 ounces of hard liquor.  Do not use street drugs.  Do not share needles.  Ask your health care provider for help if you need support or information  about quitting drugs.  Tell your health care provider if you often feel depressed.  Tell your health care provider if you have ever been abused or do not feel safe at home. This information is not intended to replace advice given to you by your health care provider. Make sure you discuss any questions you have with your health care provider. Document Released: 10/08/2010 Document Revised: 08/31/2015 Document Reviewed: 12/27/2014  2017 [employer]

## 2016-03-08 NOTE — Progress Notes (Addendum)
Subjective:   Brandy Bowman is a 65 y.o. female who presents for an Initial Medicare Annual Wellness Visit.  The Patient was informed that the wellness visit is to identify future health risk and educate and initiate measures that can reduce risk for increased disease through the lifespan.   Describes health as fair, good or great? "other than the knees, great"  Review of Systems    No ROS.  Medicare Wellness Visit.  Sleep patterns:   Sleeps at least 8 hours, up to void x 2-3.  Home Safety/Smoke Alarms:  Smoke detectors in place.  Living environment; residence and Firearm Safety: Lives with husband and 4 dogs in 2 story home, rails in place. Firearms locked away.  Seat Belt Safety/Bike Helmet: Wears seatbelt.   Counseling:   Eye Exam-Last exam 2016, followed yearly by [facility]-Last exam > 5 years. Seen for issues only. Does not have local provider currently.   Female:   Pap-09/08/2009, normal.        Mammo-06/09/2014, negative.       Dexa scan-09/27/09, normal. Declines repeat testing. Taking Vitamin D supplement.     CCS-colonoscopy 04/09/2011, pt reports normal. Recall 10 years.       Objective:    Today's Vitals   03/08/16 0830  BP: 130/80  Pulse: 75  SpO2: 97%  Weight: 259 lb 1.9 oz (117.5 kg)  Height: 5\' 5"  (1.651 m)  PainSc: 8    Body mass index is 43.12 kg/m.  TSH ordered today.   Current Medications (verified) Outpatient Encounter Prescriptions as of 03/08/2016  Medication Sig  . cetirizine (ZYRTEC) 10 MG tablet Take 10 mg by mouth as needed.   . Cholecalciferol (VITAMIN D-3 PO) Take 2,000 Int'l Units by mouth daily.  . irbesartan (AVAPRO) 150 MG tablet Take 1 tablet (150 mg total) by mouth at bedtime.  . Multiple Vitamins-Minerals (CENTRUM ULTRA WOMENS PO) Take by mouth daily.  . Naproxen Sodium ([employer]S NAPROXEN SODIUM) 220 MG CAPS Take 2 capsules (440 mg total) by mouth 2 (two) times daily.  . Omega-3 Fatty Acids (FISH OIL PO) Take by  mouth.  . rosuvastatin (CRESTOR) 10 MG tablet Take 1T po Monday through Friday.  . venlafaxine XR (EFFEXOR-XR) 75 MG 24 hr capsule Take 1 capsule (75 mg total) by mouth daily.   No facility-administered encounter medications on file as of 03/08/2016.     Allergies (verified) Patient has no known allergies.   History: Past Medical History:  Diagnosis Date  . Allergy   . Anxiety   . Arthritis   . Depression   . Hyperlipidemia   . Hypertension   . IFG (impaired fasting glucose)    A1c's all 5.9% or less  . Vitamin D deficiency    Past Surgical History:  Procedure Laterality Date  . TUBAL LIGATION     Family History  Problem Relation Age of Onset  . Dementia Mother   . Pneumonia Father   . Rheum arthritis Paternal Uncle    Social History   Occupational History  . retired Agricultural consultant    Social History Main Topics  . Smoking status: Never Smoker  . Smokeless tobacco: Never Used  . Alcohol use No  . Drug use: No  . Sexual activity: No    Tobacco Counseling Counseling given: Not Answered   Activities of Daily Living In your present state of health, do you have any difficulty performing the following activities: 03/08/2016  Hearing? N  Vision? N  Difficulty concentrating  or making decisions? N  Walking or climbing stairs? N  Dressing or bathing? N  Doing errands, shopping? N  Preparing Food and eating ? N  Using the Toilet? N  In the past six months, have you accidently leaked urine? N  Do you have problems with loss of bowel control? N  Managing your Medications? N  Managing your Finances? N  Housekeeping or managing your Housekeeping? N  Some recent data might be hidden    Immunizations and Health Maintenance Immunization History  Administered Date(s) Administered  . Influenza Split 12/28/2009  . Influenza, High Dose Seasonal PF 01/05/2016  . Influenza,inj,Quad PF,36+ Mos 01/13/2014, 01/06/2015  . Pneumococcal Conjugate-13 09/07/2015  . Tdap  04/09/2011   There are no preventive care reminders to display for this patient.  Patient Care Team: Natalia Leatherwoodenee A Kuneff, DO as PCP - General (Family Medicine)  Indicate any recent Medical Services you may have received from other than [facility] providers in the past year (date may be approximate).     Assessment:   This is a routine wellness examination for Brandy Bowman. Physical assessment deferred to PCP.   Hearing/Vision screen Hearing Screening Comments: Able to hear conversational tones w/o difficulty. No issues reported.   Vision Screening Comments: Wears corrective lenses to 20/20 vision.   Dietary issues and exercise activities discussed: Current Exercise Habits: The patient does not participate in regular exercise at present (Housework, yardwork, walking for shopping), Type of exercise: walking, Intensity: Mild, Exercise limited by: orthopedic condition(s)   Diet (meal preparation, eat out, water intake, caffeinated beverages, dairy products, fruits and vegetables): Prepares own meals. Drinks at least 8 glasses of water daily.   Breakfast: coffee (1 cup), toast, orange juice Lunch: oatmeal, cereal, eggs Dinner: meat (fried), 2 veggies  Discussed low cholesterol, diabetic diet. Patient states "i've eaten fried foods for 65 years and it hasn't killed me".  Discussed increasing protein at breakfast.   Encouraged to increase activity as tolerated. Low impact/water aerobics discussed d/t arthritic knees.   Goals      Patient Stated   . <enter goal here> (pt-stated)          To get rid of pain in knees.       Patient declined any type of PT or Ortho referral for knees/ambulation.   Depression Screen PHQ 2/9 Scores 03/08/2016  PHQ - 2 Score 0    Fall Risk Fall Risk  03/08/2016  Falls in the past year? No    Cognitive Function:       Ad8 score reviewed for issues:  Issues making decisions:no  Less interest in hobbies / activities:no  Repeats questions, stories (family  complaining):no  Trouble using ordinary gadgets (microwave, computer, phone):no  Forgets the month or year: no  Mismanaging finances:  no  Remembering appts:no  Daily problems with thinking and/or memory: n0 Ad8 score is=0     Screening Tests Health Maintenance  Topic Date Due  . ZOSTAVAX  09/06/2016 (Originally 03/16/2010)  . HIV Screening  09/06/2016 (Originally 03/16/1966)  . MAMMOGRAM  06/07/2016  . PNA vac Low Risk Adult (2 of 2 - PPSV23) 09/06/2016  . COLONOSCOPY  04/08/2021  . TETANUS/TDAP  04/08/2021  . INFLUENZA VACCINE  Completed  . DEXA SCAN  Completed  . Hepatitis C Screening  Completed      Plan:    Eat heart healthy diet (full of fruits, vegetables, whole grains, lean protein, water--limit salt, fat, and sugar intake) and increase physical activity as tolerated.  Continue  doing brain stimulating activities (puzzles, reading, adult coloring books, staying active) to keep memory sharp.   Complete advanced directives, bring copy to next appt.   During the course of the visit, Maelyn was educated and counseled about the following appropriate screening and preventive services:   Vaccines to include Pneumoccal, Influenza, Hepatitis B, Td, Zostavax, HCV  Cardiovascular disease screening  Colorectal cancer screening  Bone density screening  Diabetes screening  Glaucoma screening  Mammography/PAP  Nutrition counseling  Patient Instructions (the written plan) were given to the patient.    Alysia PennaKimberly R Keric Zehren, RN   03/08/2016    Medical screening examination/treatment/procedure(s) were performed by non-physician practitioner and as supervising physician I was immediately available for consultation/collaboration.  I agree with above assessment and plan.  Electronically Signed by: Felix Pacinienee Kuneff, DO [facility] [place]

## 2016-04-10 ENCOUNTER — Encounter: Payer: Self-pay | Admitting: Family Medicine

## 2016-04-10 ENCOUNTER — Ambulatory Visit (INDEPENDENT_AMBULATORY_CARE_PROVIDER_SITE_OTHER): Payer: Medicare Other | Admitting: Family Medicine

## 2016-04-10 VITALS — BP 141/85 | HR 86 | Temp 98.4°F | Resp 20 | Ht 65.0 in | Wt 264.0 lb

## 2016-04-10 DIAGNOSIS — E785 Hyperlipidemia, unspecified: Secondary | ICD-10-CM | POA: Diagnosis not present

## 2016-04-10 DIAGNOSIS — I10 Essential (primary) hypertension: Secondary | ICD-10-CM

## 2016-04-10 DIAGNOSIS — F32A Depression, unspecified: Secondary | ICD-10-CM

## 2016-04-10 DIAGNOSIS — F329 Major depressive disorder, single episode, unspecified: Secondary | ICD-10-CM

## 2016-04-10 MED ORDER — ROSUVASTATIN CALCIUM 10 MG PO TABS: ORAL_TABLET | ORAL | 5 refills | Status: DC

## 2016-04-10 NOTE — Progress Notes (Signed)
   Subjective:    Patient ID: Tomasa HoseMimi Roa-Remache, female    DOB: 11/14/1950, 66 y.o.   MRN: [id number]  HPI   Cc: Patient presents for follow-up on chronic issues Essential hypertension, benign//hyperlipidemia: Pt reports compliance with Crestor 10 mg, fish oil supplement and irbesartan 150mg . Hypertension is well controlled on medications. She denies chest pain, shortness of breath, dizziness, headache or edema. She does not exercise routinely, secondary to her arthritis. She has had microalbuminuria, with normal GFR in the past, now on a sartan.  She is not on a daily aspirin secondary to tolerance.  Depressive disorder: Pt reports compliance with effexor 75 mg QD. She denies any side effects on medication. She feels her anxiety is well controlled on medication, She does endorse extra stress associate with the care of her husband. She states she has made her self a "man cave" that she can go to relax when needed.  Past Medical History:  Diagnosis Date  . Allergy   . Anxiety   . Arthritis   . Depression   . Hyperlipidemia   . Hypertension   . IFG (impaired fasting glucose)    A1c's all 5.9% or less  . Vitamin D deficiency    No Known Allergies   Review of Systems 14 pt ROS Negative, with the exception of above mentioned in HPI     Objective:   Physical Exam BP (!) 141/85 (BP Location: Right Arm, Patient Position: Sitting, Cuff Size: Large)   Pulse 86   Temp 98.4 F (36.9 C)   Resp 20   Ht 5\' 5"  (1.651 m)   Wt 264 lb (119.7 kg)   SpO2 97%   BMI 43.93 kg/m   Gen: Afebrile. No acute distress. Pleasant obese caucasian female.  HENT: AT. [place].  MMM.  Eyes:Pupils Equal Round Reactive to light, Extraocular movements intact,  Conjunctiva without redness, discharge or icterus. CV: RRR no murmur, no edema, +2/4 P posterior tibialis pulses Chest: CTAB, no wheeze or crackles Abd: Soft. NTND. BS present.   Neuro:  Normal gait. PERLA. EOMi. Alert. Oriented.  Psych: Normal affect, dress  and demeanor. Normal speech. Normal thought content and judgment..      Assessment & Plan:  Hayven Roa-Remache is a 66 y.o. female present for follow up on chronic medical conditions. Essential hypertension, benign - Watch added salt and exercise > 150 minutes a week  - Continue Irbesartan 150 mg Qd - BMP yearly: normal GFR 09/07/2015 - microalbuminuria present 06/2014, pt is on a -sartan.  - unable to tolerate ASA - montior BP at home, she is a little more elevated today than her normal. She was given instruction if > 140/90 routinely, then I would want to see her, otherwise f/u every 6 months.   Depressive disorder - stable. - continue effexor 75 mg.  - f/u q 6 months.   Hyperlipidemia - Continue crestor, refills provided today.  - rosuvastatin (CRESTOR) 10 MG tablet; Take 1T po Monday through Friday.  Dispense: 24 tablet; Refill: 5  Electronically Signed by: Felix Pacinienee Yutaka Holberg, DO [facility]- [place]

## 2016-04-10 NOTE — Patient Instructions (Signed)
It was a pleasure seeing you today.  Check your blood pressure at home over next few weeks, if above 140/90 routinely I would want to see you to adjust medications.  Watch the salt in your diet. Try to exercise > 150 minutes a week will also help with BP.  If BP is normal at home, follow up every 6 months.    Please help [place] help you:  It is a privilege to be able to take care of great patients such as yourself. We are honored you have chosen [facility] for your Primary Care home. Below you will find basic instructions that you may need to access in the future. Please help [place] help you by reading the instructions, which cover many of the frequent questions we experience.   Prescription refills and request:  -In order to allow more efficient response time, please call your pharmacy for all refills. They will forward the request electronically to [place]. This allows for the quickest possible response. Request left on a nurse line can take longer to refill, since these are checked as time allows between office patients and other phone calls.  - refill request can take up to 3-5 working days to complete.  - If request is sent electronically and request is appropiate, it is usually completed in 1-2 business days.  - all patients will need to be seen routinely for all chronic medical conditions requiring prescription medications (see follow-up below). If you are overdue for follow up on your condition, you will be asked to make an appointment and we will call in enough medication to cover you until your appointment (up to 30 days).  - all controlled substances will require a face to face visit to request/refill.  - if you desire your prescriptions to go through a new pharmacy, and have an active script at original pharmacy, you will need to call your pharmacy and have scripts transferred to new pharmacy. This is completed between the pharmacy locations and not by your provider.    Results: If any images  or labs were ordered, it can take up to 1 week to get results depending on the test ordered and the lab/facility running and resulting the test. - Normal or stable results, which do not need further discussion, will be released to your mychart immediately with attached note to you. A call will not be generated for normal results. Please make certain to sign up for mychart. If you have questions on how to activate your mychart you can call the front office.  - If your results need further discussion, our office will attempt to contact you via phone, and if unable to reach you after 2 attempts, we will release your abnormal result to your mychart with instructions.  - All results will be automatically released in mychart after 1 week.  - Your provider will provide you with explanation and instruction on all relevant material in your results. Please keep in mind, results and labs may appear confusing or abnormal to the untrained eye, but it does not mean they are actually abnormal for you personally. If you have any questions about your results that are not covered, or you desire more detailed explanation than what was provided, you should make an appointment with your provider to do so.   Our office handles many outgoing and incoming calls daily. If we have not contacted you within 1 week about your results, please check your mychart to see if there is a message  first and if not, then contact our office.  In helping with this matter, you help decrease call volume, and therefore allow us to be able to respond to patients needs more efficiently.   Acute office visits (sick visit):  An acute visit is intended for a new problem and are scheduled in shorter time slots to allow schedule openings for patients with new problems. This is the appropriate visit to discuss a new problem. In order to provide you with excellent quality medical care with proper time for you to explain your problem, have an exam and receive  treatment with instructions, these appointments should be limited to one new problem per visit. If you experience a new problem, in which you desire to be addressed, please make an acute office visit, we save openings on the schedule to accommodate you. Please do not save your new problem for any other type of visit, let us take care of it properly and quickly for you.   Follow up visits:  Depending on your condition(s) your provider will need to see you routinely in order to provide you with quality care and prescribe medication(s). Most chronic conditions (Example: hypertension, Diabetes, depression/anxiety... etc), require visits a couple times a year. Your provider will instruct you on proper follow up for your personal medical conditions and history. Please make certain to make follow up appointments for your condition as instructed. Failing to do so could result in lapse in your medication treatment/refills. If you request a refill, and are overdue to be seen on a condition, we will always provide you with a 30 day script (once) to allow you time to schedule.    Medicare wellness (well visit): - we have a wonderful Nurse Selena Batten(Kim), that will meet with you and provide you will yearly medicare wellness visits. These visits should occur yearly (can not be scheduled less than 1 calendar year apart) and cover preventive health, immunizations, advance directives and screenings you are entitled to yearly through your medicare benefits. Do not miss out on your entitled benefits, this is when medicare will pay for these benefits to be ordered for you.  These are strongly encouraged by your provider and is the appropriate type of visit to make certain you are up to date with all preventive health benefits. If you have not had your medicare wellness exam in the last 12 months, please make certain to schedule one by calling the office and schedule your medicare wellness with Selena BattenKim as soon as possible.   Yearly physical  (well visit):  - Adults are recommended to be seen yearly for physicals. Check with your insurance and date of your last physical, most insurances require one calendar year between physicals. Physicals include all preventive health topics, screenings, medical exam and labs that are appropriate for gender/age and history. You may have fasting labs needed at this visit. This is a well visit (not a sick visit), acute topics should not be covered during this visit.  - Pediatric patients are seen more frequently when they are younger. Your provider will advise you on well child visit timing that is appropriate for your their age. - This is not a medicare wellness visit. Medicare wellness exams do not have an exam portion to the visit. Some medicare companies allow for a physical, some do not allow a yearly physical. If your medicare allows a yearly physical you can schedule the medicare wellness with our nurse Selena BattenKim and have your physical with your provider after, on the same  day. Please check with insurance for your full benefits.   Late Policy/No Shows:  - all new patients should arrive 15-30 minutes earlier than appointment to allow [place] time  to  obtain all personal demographics,  insurance information and for you to complete office paperwork. - All established patients should arrive 10-15 minutes earlier than appointment time to update all information and be checked in .  - In our best efforts to run on time, if you are late for your appointment you will be asked to either reschedule or if able, we will work you back into the schedule. There will be a wait time to work you back in the schedule,  depending on availability.  - If you are unable to make it to your appointment as scheduled, please call 24 hours ahead of time to allow [place] to fill the time slot with someone else who needs to be seen. If you do not cancel your appointment ahead of time, you may be charged a no show fee.

## 2016-08-02 ENCOUNTER — Other Ambulatory Visit: Payer: Self-pay | Admitting: Family Medicine

## 2016-08-02 DIAGNOSIS — E785 Hyperlipidemia, unspecified: Secondary | ICD-10-CM

## 2016-08-02 NOTE — Telephone Encounter (Signed)
Dr. Kuneff pt.  

## 2016-08-19 ENCOUNTER — Other Ambulatory Visit: Payer: Self-pay

## 2016-08-19 DIAGNOSIS — I10 Essential (primary) hypertension: Secondary | ICD-10-CM

## 2016-08-19 MED ORDER — IRBESARTAN 150 MG PO TABS: 150.0000 mg | ORAL_TABLET | Freq: Every day | ORAL | 0 refills | Status: DC

## 2016-08-19 NOTE — Telephone Encounter (Signed)
Refill on Irbesartan sent to pharmacy

## 2016-10-08 ENCOUNTER — Ambulatory Visit (INDEPENDENT_AMBULATORY_CARE_PROVIDER_SITE_OTHER): Payer: Medicare Other | Admitting: Family Medicine

## 2016-10-08 ENCOUNTER — Encounter: Payer: Self-pay | Admitting: Family Medicine

## 2016-10-08 VITALS — BP 132/85 | HR 77 | Temp 97.9°F | Resp 20 | Ht 65.0 in | Wt 263.2 lb

## 2016-10-08 DIAGNOSIS — I10 Essential (primary) hypertension: Secondary | ICD-10-CM | POA: Diagnosis not present

## 2016-10-08 DIAGNOSIS — R7309 Other abnormal glucose: Secondary | ICD-10-CM | POA: Diagnosis not present

## 2016-10-08 DIAGNOSIS — R809 Proteinuria, unspecified: Secondary | ICD-10-CM | POA: Diagnosis not present

## 2016-10-08 DIAGNOSIS — E559 Vitamin D deficiency, unspecified: Secondary | ICD-10-CM | POA: Diagnosis not present

## 2016-10-08 DIAGNOSIS — E782 Mixed hyperlipidemia: Secondary | ICD-10-CM

## 2016-10-08 DIAGNOSIS — F32A Depression, unspecified: Secondary | ICD-10-CM

## 2016-10-08 DIAGNOSIS — F329 Major depressive disorder, single episode, unspecified: Secondary | ICD-10-CM

## 2016-10-08 LAB — LIPID PANEL
CHOL/HDL RATIO: 4
Cholesterol: 167 mg/dL (ref 0–200)
HDL: 45 mg/dL (ref >39.00)
LDL Cholesterol: 89 mg/dL (ref 0–99)
NONHDL: 121.87
Triglycerides: 166 mg/dL — ABNORMAL HIGH (ref 0.0–149.0)
VLDL: 33.2 mg/dL (ref 0.0–40.0)

## 2016-10-08 LAB — CBC WITH DIFFERENTIAL/PLATELET
BASOS ABS: 0.1 10*3/uL (ref 0.0–0.1)
Basophils Relative: 1.3 % (ref 0.0–3.0)
EOS ABS: 0.3 10*3/uL (ref 0.0–0.7)
Eosinophils Relative: 3.5 % (ref 0.0–5.0)
HEMATOCRIT: 40.8 % (ref 36.0–46.0)
Hemoglobin: 13.6 g/dL (ref 12.0–15.0)
LYMPHS PCT: 26 % (ref 12.0–46.0)
Lymphs Abs: 2.2 10*3/uL (ref 0.7–4.0)
MCHC: 33.4 g/dL (ref 30.0–36.0)
MCV: 90.2 fl (ref 78.0–100.0)
MONOS PCT: 6.4 % (ref 3.0–12.0)
Monocytes Absolute: 0.5 10*3/uL (ref 0.1–1.0)
Neutro Abs: 5.3 10*3/uL (ref 1.4–7.7)
Neutrophils Relative %: 62.8 % (ref 43.0–77.0)
Platelets: 279 10*3/uL (ref 150.0–400.0)
RBC: 4.52 Mil/uL (ref 3.87–5.11)
RDW: 13 % (ref 11.5–15.5)
WBC: 8.5 10*3/uL (ref 4.0–10.5)

## 2016-10-08 LAB — BASIC METABOLIC PANEL WITH GFR
BUN: 14 mg/dL (ref 7–25)
CHLORIDE: 106 mmol/L (ref 98–110)
CO2: 25 mmol/L (ref 20–31)
Calcium: 9.1 mg/dL (ref 8.6–10.4)
Creat: 0.61 mg/dL (ref 0.50–0.99)
GLUCOSE: 102 mg/dL — AB (ref 65–99)
POTASSIUM: 4.4 mmol/L (ref 3.5–5.3)
Sodium: 141 mmol/L (ref 135–146)

## 2016-10-08 LAB — TSH: TSH: 2.91 u[IU]/mL (ref 0.35–4.50)

## 2016-10-08 LAB — VITAMIN D 25 HYDROXY (VIT D DEFICIENCY, FRACTURES): VITD: 35.53 ng/mL (ref 30.00–100.00)

## 2016-10-08 MED ORDER — VENLAFAXINE HCL ER 75 MG PO CP24: 75.0000 mg | ORAL_CAPSULE | Freq: Every day | ORAL | 3 refills | Status: DC

## 2016-10-08 MED ORDER — IRBESARTAN 150 MG PO TABS: 150.0000 mg | ORAL_TABLET | Freq: Every day | ORAL | 0 refills | Status: DC

## 2016-10-08 NOTE — Progress Notes (Signed)
Subjective:    Patient ID: Brandy Bowman, female    DOB: April 20, 1950, 66 y.o.   MRN: [id number]  HPI   Chief Complaint  Patient presents with  . Hypertension  . Depression    Essential hypertension, benign//hyperlipidemia:   Pt reports compliance with Crestor 10 mg, fish oil supplement and irbesartan 150mg . Blood pressures ranges at home are WNL. Patient denies chest pain, shortness of breath or lower extremity edema. Pt is not on  ASA 2/2 daily naproxen use. Pt is prescribed statin. BMP: 09/07/2015 WNL CBC: 11/25/2013 WNL Lipid panel: 09/07/2015 Total 160, HDL 42, LDL 84 Microalbuminuria: 2.7 06/24/2014--> now on a sartan Diet: Low sodium,  Very little meat consumption  Exercise: Not exercising routinely.  RF: HTN, HLD, No Fhx   Vit d def: takes a daily supplement.   Depressive disorder:  Doing well on effexor 75 mg QD.She denies any side effects on medication. She feels her anxiety is well controlled.   Depression screen [facility] 2/9 10/08/2016 03/08/2016  Decreased Interest 0 0  Down, Depressed, Hopeless 3 0  PHQ - 2 Score 3 0  Altered sleeping 0 -  Tired, decreased energy 1 -  Change in appetite 0 -  Feeling bad or failure about yourself  0 -  Trouble concentrating 0 -  Moving slowly or fidgety/restless 0 -  Suicidal thoughts 0 -  PHQ-9 Score 4 -      Past Medical History:  Diagnosis Date  . Allergy   . Anxiety   . Arthritis   . Depression   . Hyperlipidemia   . Hypertension   . IFG (impaired fasting glucose)    A1c's all 5.9% or less  . Vitamin D deficiency    No Known Allergies   Review of Systems  Psychiatric/Behavioral: Positive for depression.   14 pt ROS Negative, with the exception of above mentioned in HPI     Objective:   Physical Exam BP 132/85 (BP Location: Right Arm, Patient Position: Sitting, Cuff Size: Large)   Pulse 77   Temp 97.9 F (36.6 C)   Resp 20   Ht 5\' 5"  (1.651 m)   Wt 263 lb 4 oz (119.4 kg)   SpO2 97%   BMI 43.81 kg/m     Gen: Afebrile. No acute distress.  HENT: AT. [place]. MMM.  Eyes:Pupils Equal Round Reactive to light, Extraocular movements intact,  Conjunctiva without redness, discharge or icterus. Neck/lymp/endocrine: Supple,no lymphadenopathy, no thyromegaly CV: RRR no murmur, no edema, +2/4 P posterior tibialis pulses Chest: CTAB, no wheeze or crackles Abd: Soft. obese. NTND. BS present. no Masses palpated.  Skin: no rashes, purpura or petechiae.  Neuro:  Normal gait. PERLA. EOMi. Alert. Oriented x3  Psych: Normal affect, dress and demeanor. Normal speech. Normal thought content and judgment.     Assessment & Plan:  Brandy Bowman is a 66 y.o. female present for follow up on chronic medical conditions. Essential hypertension, benign - Stable today. - Watch added salt and exercise > 150 minutes a week  - Continue Irbesartan 150 mg Qd - BMP yearly: normal GFR 09/07/2015--> collected today - microalbuminuria present 06/2014, pt is on a -sartan. Recollected today - No ASA use --> takes daily naproxen - F/U 6 months.   Depressive disorder - stable. Doing well on current dose.  - continue effexor 75 mg.  - f/u q 6 months.   Hyperlipidemia - lipids collected today. No fhx reported.  - Continue crestor-refills proved today.  - rosuvastatin (CRESTOR) 10  MG tablet; Take 1T po Monday through Friday.  Dispense: 24 tablet; Refill: 5 - Follow yearly.   Electronically Signed by: Felix Pacinienee Myrle Wanek, DO [facility]- [place]

## 2016-10-08 NOTE — Patient Instructions (Addendum)
I have refilled your medications today.  I ma glad you are doing well.  We will call you with your lab results once available.    Follow 6 months on chronic medical conditions.   Please help us help you:  We are honored [facility]. Below you will find basic instructions that you may need to access in the future. Please help us help you by reading the instructions, which cover many of the frequent questions we experience.   Prescription refills and request:  -In order to allow more efficient response time, please call your pharmacy for all refills. They will forward the request electronically to us. This allows for the quickest possible response. Request left on a nurse line can take longer to refill, since these are checked as time allows between office patients and other phone calls.  - refill request can take up to 3-5 working days to complete.  - If request is sent electronically and request is appropiate, it is usually completed in 1-2 business days.  - all patients will need to be seen routinely for all chronic medical conditions requiring prescription medications (see follow-up below). If you are overdue for follow up on your condition, you will be asked to make an appointment and we will call in enough medication to cover you until your appointment (up to 30 days).  - all controlled substances will require a face to face visit to request/refill.  - if you desire your prescriptions to go through a new pharmacy, and have an active script at original pharmacy, you will need to call your pharmacy and have scripts transferred to new pharmacy. This is completed between the pharmacy locations and not by your provider.    Results: If any images or labs were ordered, it can take up to 1 week to get results depending on the test ordered and the lab/facility running and resulting the test. - Normal or stable results, which do not need further  discussion, may be released to your mychart immediately with attached note to you. A call may not be generated for normal results. Please make certain to sign up for mychart. If you have questions on how to activate your mychart you can call the front office.  - If your results need further discussion, our office will attempt to contact you via phone, and if unable to reach you after 2 attempts, we will release your abnormal result to your mychart with instructions.  - All results will be automatically released in mychart after 1 week.  - Your provider will provide you with explanation and instruction on all relevant material in your results. Please keep in mind, results and labs may appear confusing or abnormal to the untrained eye, but it does not mean they are actually abnormal for you personally. If you have any questions about your results that are not covered, or you desire more detailed explanation than what was provided, you should make an appointment with your provider to do so.   Our office handles many outgoing and incoming calls daily. If we have not contacted you within 1 week about your results, please check your mychart to see if there is a message first and if not, then contact our office.  In helping with this matter, you help decrease call volume, and therefore allow us to be able to respond to patients needs more efficiently.   Acute office visits (sick visit):  An acute visit is  intended for a new problem and are scheduled in shorter time slots to allow schedule openings for patients with new problems. This is the appropriate visit to discuss a new problem. In order to provide you with excellent quality medical care with proper time for you to explain your problem, have an exam and receive treatment with instructions, these appointments should be limited to one new problem per visit. If you experience a new problem, in which you desire to be addressed, please make an acute office visit,  we save openings on the schedule to accommodate you. Please do not save your new problem for any other type of visit, let us take care of it properly and quickly for you.   Follow up visits:  Depending on your condition(s) your provider will need to see you routinely in order to provide you with quality care and prescribe medication(s). Most chronic conditions (Example: hypertension, Diabetes, depression/anxiety... etc), require visits a couple times a year. Your provider will instruct you on proper follow up for your personal medical conditions and history. Please make certain to make follow up appointments for your condition as instructed. Failing to do so could result in lapse in your medication treatment/refills. If you request a refill, and are overdue to be seen on a condition, we will always provide you with a 30 day script (once) to allow you time to schedule.    Medicare wellness (well visit): - we have a wonderful Nurse Maudie Mercury), that will meet with you and provide you will yearly medicare wellness visits. These visits should occur yearly (can not be scheduled less than 1 calendar year apart) and cover preventive health, immunizations, advance directives and screenings you are entitled to yearly through your medicare benefits. Do not miss out on your entitled benefits, this is when medicare will pay for these benefits to be ordered for you.  These are strongly encouraged by your provider and is the appropriate type of visit to make certain you are up to date with all preventive health benefits. If you have not had your medicare wellness exam in the last 12 months, please make certain to schedule one by calling the office and schedule your medicare wellness with Maudie Mercury as soon as possible.   Yearly physical (well visit):  - Adults are recommended to be seen yearly for physicals. Check with your insurance and date of your last physical, most insurances require one calendar year between physicals.  Physicals include all preventive health topics, screenings, medical exam and labs that are appropriate for gender/age and history. You may have fasting labs needed at this visit. This is a well visit (not a sick visit), new problems should not be covered during this visit (see acute visit).  - Pediatric patients are seen more frequently when they are younger. Your provider will advise you on well child visit timing that is appropriate for your their age. - This is not a medicare wellness visit. Medicare wellness exams do not have an exam portion to the visit. Some medicare companies allow for a physical, some do not allow a yearly physical. If your medicare allows a yearly physical you can schedule the medicare wellness with our nurse Maudie Mercury and have your physical with your provider after, on the same day. Please check with insurance for your full benefits.   Late Policy/No Shows:  - all new patients should arrive 15-30 minutes earlier than appointment to allow [place] time  to  obtain all personal demographics,  insurance information and for  you to complete office paperwork. - All established patients should arrive 10-15 minutes earlier than appointment time to update all information and be checked in .  - In our best efforts to run on time, if you are late for your appointment you will be asked to either reschedule or if able, we will work you back into the schedule. There will be a wait time to work you back in the schedule,  depending on availability.  - If you are unable to make it to your appointment as scheduled, please call 24 hours ahead of time to allow [place] to fill the time slot with someone else who needs to be seen. If you do not cancel your appointment ahead of time, you may be charged a no show fee.

## 2016-10-09 LAB — HEMOGLOBIN A1C
Hgb A1c MFr Bld: 5.7 % — ABNORMAL HIGH (ref <5.7)
Mean Plasma Glucose: 117 mg/dL

## 2016-10-10 ENCOUNTER — Other Ambulatory Visit: Payer: Self-pay | Admitting: *Deleted

## 2016-10-10 DIAGNOSIS — E7849 Other hyperlipidemia: Secondary | ICD-10-CM

## 2016-10-10 LAB — MICROALBUMIN / CREATININE URINE RATIO
CREATININE, URINE: 228 mg/dL (ref 20–320)
MICROALB/CREAT RATIO: 21 ug/mg{creat} (ref <30)
Microalb, Ur: 4.8 mg/dL

## 2016-10-10 MED ORDER — ROSUVASTATIN CALCIUM 10 MG PO TABS: ORAL_TABLET | ORAL | 5 refills | Status: DC

## 2016-10-11 ENCOUNTER — Telehealth: Payer: Self-pay | Admitting: Family Medicine

## 2016-10-11 NOTE — Telephone Encounter (Signed)
Please call pt: - her labs are stable.  

## 2016-10-11 NOTE — Telephone Encounter (Signed)
Spoke with patient reviewed lab results. 

## 2016-11-05 DIAGNOSIS — H2513 Age-related nuclear cataract, bilateral: Secondary | ICD-10-CM | POA: Diagnosis not present

## 2017-01-17 ENCOUNTER — Ambulatory Visit: Payer: Self-pay

## 2017-01-20 ENCOUNTER — Ambulatory Visit (INDEPENDENT_AMBULATORY_CARE_PROVIDER_SITE_OTHER): Payer: Medicare Other

## 2017-01-20 DIAGNOSIS — Z23 Encounter for immunization: Secondary | ICD-10-CM

## 2017-02-18 ENCOUNTER — Other Ambulatory Visit: Payer: Self-pay | Admitting: *Deleted

## 2017-02-18 DIAGNOSIS — I10 Essential (primary) hypertension: Secondary | ICD-10-CM

## 2017-02-18 MED ORDER — IRBESARTAN 150 MG PO TABS: 150.0000 mg | ORAL_TABLET | Freq: Every day | ORAL | 0 refills | Status: DC

## 2017-03-11 ENCOUNTER — Ambulatory Visit (INDEPENDENT_AMBULATORY_CARE_PROVIDER_SITE_OTHER): Payer: Medicare Other

## 2017-03-11 ENCOUNTER — Other Ambulatory Visit: Payer: Self-pay

## 2017-03-11 VITALS — BP 132/80 | HR 77

## 2017-03-11 DIAGNOSIS — Z0000 Encounter for general adult medical examination without abnormal findings: Secondary | ICD-10-CM | POA: Diagnosis not present

## 2017-03-11 NOTE — Patient Instructions (Addendum)
Bring a copy of your living will and/or healthcare power of attorney to your next office visit.  Continue doing brain stimulating activities (puzzles, reading, adult coloring books, staying active) to keep memory sharp.   Fall Prevention in the Home Falls can cause injuries. They can happen to people of all ages. There are many things you can do to make your home safe and to help prevent falls. What can I do on the outside of my home?  Regularly fix the edges of walkways and driveways and fix any cracks.  Remove anything that might make you trip as you walk through a door, such as a raised step or threshold.  Trim any bushes or trees on the path to your home.  Use bright outdoor lighting.  Clear any walking paths of anything that might make someone trip, such as rocks or tools.  Regularly check to see if handrails are loose or broken. Make sure that both sides of any steps have handrails.  Any raised decks and porches should have guardrails on the edges.  Have any leaves, snow, or ice cleared regularly.  Use sand or salt on walking paths during winter.  Clean up any spills in your garage right away. This includes oil or grease spills. What can I do in the bathroom?  Use night lights.  Install grab bars by the toilet and in the tub and shower. Do not use towel bars as grab bars.  Use non-skid mats or decals in the tub or shower.  If you need to sit down in the shower, use a plastic, non-slip stool.  Keep the floor dry. Clean up any water that spills on the floor as soon as it happens.  Remove soap buildup in the tub or shower regularly.  Attach bath mats securely with double-sided non-slip rug tape.  Do not have throw rugs and other things on the floor that can make you trip. What can I do in the bedroom?  Use night lights.  Make sure that you have a light by your bed that is easy to reach.  Do not use any sheets or blankets that are too big for your bed. They should  not hang down onto the floor.  Have a firm chair that has side arms. You can use this for support while you get dressed.  Do not have throw rugs and other things on the floor that can make you trip. What can I do in the kitchen?  Clean up any spills right away.  Avoid walking on wet floors.  Keep items that you use a lot in easy-to-reach places.  If you need to reach something above you, use a strong step stool that has a grab bar.  Keep electrical cords out of the way.  Do not use floor polish or wax that makes floors slippery. If you must use wax, use non-skid floor wax.  Do not have throw rugs and other things on the floor that can make you trip. What can I do with my stairs?  Do not leave any items on the stairs.  Make sure that there are handrails on both sides of the stairs and use them. Fix handrails that are broken or loose. Make sure that handrails are as long as the stairways.  Check any carpeting to make sure that it is firmly attached to the stairs. Fix any carpet that is loose or worn.  Avoid having throw rugs at the top or bottom of the stairs. If you  do have throw rugs, attach them to the floor with carpet tape.  Make sure that you have a light switch at the top of the stairs and the bottom of the stairs. If you do not have them, ask someone to add them for you. What else can I do to help prevent falls?  Wear shoes that: ? Do not have high heels. ? Have rubber bottoms. ? Are comfortable and fit you well. ? Are closed at the toe. Do not wear sandals.  If you use a stepladder: ? Make sure that it is fully opened. Do not climb a closed stepladder. ? Make sure that both sides of the stepladder are locked into place. ? Ask someone to hold it for you, if possible.  Clearly mark and make sure that you can see: ? Any grab bars or handrails. ? First and last steps. ? Where the edge of each step is.  Use tools that help you move around (mobility aids) if they are  needed. These include: ? Canes. ? Walkers. ? Scooters. ? Crutches.  Turn on the lights when you go into a dark area. Replace any light bulbs as soon as they burn out.  Set up your furniture so you have a clear path. Avoid moving your furniture around.  If any of your floors are uneven, fix them.  If there are any pets around you, be aware of where they are.  Review your medicines with your doctor. Some medicines can make you feel dizzy. This can increase your chance of falling. Ask your doctor what other things that you can do to help prevent falls. This information is not intended to replace advice given to you by your health care provider. Make sure you discuss any questions you have with your health care provider. Document Released: 01/19/2009 Document Revised: 08/31/2015 Document Reviewed: 04/29/2014 Elsevier Interactive Patient Education  2018 [place] Maintenance, Female Adopting a healthy lifestyle and getting preventive care can go a long way to promote health and wellness. Talk with your health care provider about what schedule of regular examinations is right for you. This is a good chance for you to check in with your provider about disease prevention and staying healthy. In between checkups, there are plenty of things you can do on your own. Experts have done a lot of research about which lifestyle changes and preventive measures are most likely to keep you healthy. Ask your health care provider for more information. Weight and diet Eat a healthy diet  Be sure to include plenty of vegetables, fruits, low-fat dairy products, and lean protein.  Do not eat a lot of foods high in solid fats, added sugars, or salt.  Get regular exercise. This is one of the most important things you can do for your health. ? Most adults should exercise for at least 150 minutes each week. The exercise should increase your heart rate and make you sweat (moderate-intensity  exercise). ? Most adults should also do strengthening exercises at least twice a week. This is in addition to the moderate-intensity exercise.  Maintain a healthy weight  Body mass index (BMI) is a measurement that can be used to identify possible weight problems. It estimates body fat based on height and weight. Your health care provider can help determine your BMI and help you achieve or maintain a healthy weight.  For females 45 years of age and older: ? A BMI below 18.5 is considered underweight. ? A BMI of 18.5  to 24.9 is normal. ? A BMI of 25 to 29.9 is considered overweight. ? A BMI of 30 and above is considered obese.  Watch levels of cholesterol and blood lipids  You should start having your blood tested for lipids and cholesterol at 67 years of age, then have this test every 5 years.  You may need to have your cholesterol levels checked more often if: ? Your lipid or cholesterol levels are high. ? You are older than 67 years of age. ? You are at high risk for heart disease.  Cancer screening Lung Cancer  Lung cancer screening is recommended for adults 37-49 years old who are at high risk for lung cancer because of a history of smoking.  A yearly low-dose CT scan of the lungs is recommended for people who: ? Currently smoke. ? Have quit within the past 15 years. ? Have at least a 30-pack-year history of smoking. A pack year is smoking an average of one pack of cigarettes a day for 1 year.  Yearly screening should continue until it has been 15 years since you quit.  Yearly screening should stop if you develop a health problem that would prevent you from having lung cancer treatment.  Breast Cancer  Practice breast self-awareness. This means understanding how your breasts normally appear and feel.  It also means doing regular breast self-exams. Let your health care provider know about any changes, no matter how small.  If you are in your 20s or 30s, you should have a  clinical breast exam (CBE) by a health care provider every 1-3 years as part of a regular health exam.  If you are 77 or older, have a CBE every year. Also consider having a breast X-ray (mammogram) every year.  If you have a family history of breast cancer, talk to your health care provider about genetic screening.  If you are at high risk for breast cancer, talk to your health care provider about having an MRI and a mammogram every year.  Breast cancer gene (BRCA) assessment is recommended for women who have family members with BRCA-related cancers. BRCA-related cancers include: ? Breast. ? Ovarian. ? Tubal. ? Peritoneal cancers.  Results of the assessment will determine the need for genetic counseling and BRCA1 and BRCA2 testing.  Cervical Cancer Your health care provider may recommend that you be screened regularly for cancer of the pelvic organs (ovaries, uterus, and vagina). This screening involves a pelvic examination, including checking for microscopic changes to the surface of your cervix (Pap test). You may be encouraged to have this screening done every 3 years, beginning at age 63.  For women ages 34-65, health care providers may recommend pelvic exams and Pap testing every 3 years, or they may recommend the Pap and pelvic exam, combined with testing for human papilloma virus (HPV), every 5 years. Some types of HPV increase your risk of cervical cancer. Testing for HPV may also be done on women of any age with unclear Pap test results.  Other health care providers may not recommend any screening for nonpregnant women who are considered low risk for pelvic cancer and who do not have symptoms. Ask your health care provider if a screening pelvic exam is right for you.  If you have had past treatment for cervical cancer or a condition that could lead to cancer, you need Pap tests and screening for cancer for at least 20 years after your treatment. If Pap tests have been discontinued,  your risk  factors (such as having a new sexual partner) need to be reassessed to determine if screening should resume. Some women have medical problems that increase the chance of getting cervical cancer. In these cases, your health care provider may recommend more frequent screening and Pap tests.  Colorectal Cancer  This type of cancer can be detected and often prevented.  Routine colorectal cancer screening usually begins at 67 years of age and continues through 68 years of age.  Your health care provider may recommend screening at an earlier age if you have risk factors for colon cancer.  Your health care provider may also recommend using home test kits to check for hidden blood in the stool.  A small camera at the end of a tube can be used to examine your colon directly (sigmoidoscopy or colonoscopy). This is done to check for the earliest forms of colorectal cancer.  Routine screening usually begins at age 23.  Direct examination of the colon should be repeated every 5-10 years through 67 years of age. However, you may need to be screened more often if early forms of precancerous polyps or small growths are found.  Skin Cancer  Check your skin from head to toe regularly.  Tell your health care provider about any new moles or changes in moles, especially if there is a change in a mole's shape or color.  Also tell your health care provider if you have a mole that is larger than the size of a pencil eraser.  Always use sunscreen. Apply sunscreen liberally and repeatedly throughout the day.  Protect yourself by wearing long sleeves, pants, a wide-brimmed hat, and sunglasses whenever you are outside.  Heart disease, diabetes, and high blood pressure  High blood pressure causes heart disease and increases the risk of stroke. High blood pressure is more likely to develop in: ? People who have blood pressure in the high end of the normal range (130-139/85-89 mm Hg). ? People who are  overweight or obese. ? People who are African American.  If you are 62-39 years of age, have your blood pressure checked every 3-5 years. If you are 1 years of age or older, have your blood pressure checked every year. You should have your blood pressure measured twice-once when you are at a hospital or clinic, and once when you are not at a hospital or clinic. Record the average of the two measurements. To check your blood pressure when you are not at a hospital or clinic, you can use: ? An automated blood pressure machine at a pharmacy. ? A home blood pressure monitor.  If you are between 8 years and 54 years old, ask your health care provider if you should take aspirin to prevent strokes.  Have regular diabetes screenings. This involves taking a blood sample to check your fasting blood sugar level. ? If you are at a normal weight and have a low risk for diabetes, have this test once every three years after 67 years of age. ? If you are overweight and have a high risk for diabetes, consider being tested at a younger age or more often. Preventing infection Hepatitis B  If you have a higher risk for hepatitis B, you should be screened for this virus. You are considered at high risk for hepatitis B if: ? You were born in a country where hepatitis B is common. Ask your health care provider which countries are considered high risk. ? Your parents were born in a high-risk country, and  you have not been immunized against hepatitis B (hepatitis B vaccine). ? You have HIV or AIDS. ? You use needles to inject street drugs. ? You live with someone who has hepatitis B. ? You have had sex with someone who has hepatitis B. ? You get hemodialysis treatment. ? You take certain medicines for conditions, including cancer, organ transplantation, and autoimmune conditions.  Hepatitis C  Blood testing is recommended for: ? Everyone born from [birth year] through 1966. ? Anyone with known risk factors for  hepatitis C.  Sexually transmitted infections (STIs)  You should be screened for sexually transmitted infections (STIs) including gonorrhea and chlamydia if: ? You are sexually active and are younger than 67 years of age. ? You are older than 67 years of age and your health care provider tells you that you are at risk for this type of infection. ? Your sexual activity has changed since you were last screened and you are at an increased risk for chlamydia or gonorrhea. Ask your health care provider if you are at risk.  If you do not have HIV, but are at risk, it may be recommended that you take a prescription medicine daily to prevent HIV infection. This is called pre-exposure prophylaxis (PrEP). You are considered at risk if: ? You are sexually active and do not regularly use condoms or know the HIV status of your partner(s). ? You take drugs by injection. ? You are sexually active with a partner who has HIV.  Talk with your health care provider about whether you are at high risk of being infected with HIV. If you choose to begin PrEP, you should first be tested for HIV. You should then be tested every 3 months for as long as you are taking PrEP. Pregnancy  If you are premenopausal and you may become pregnant, ask your health care provider about preconception counseling.  If you may become pregnant, take 400 to 800 micrograms (mcg) of folic acid every day.  If you want to prevent pregnancy, talk to your health care provider about birth control (contraception). Osteoporosis and menopause  Osteoporosis is a disease in which the bones lose minerals and strength with aging. This can result in serious bone fractures. Your risk for osteoporosis can be identified using a bone density scan.  If you are 65 years of age or older, or if you are at risk for osteoporosis and fractures, ask your health care provider if you should be screened.  Ask your health care provider whether you should take a  calcium or vitamin D supplement to lower your risk for osteoporosis.  Menopause may have certain physical symptoms and risks.  Hormone replacement therapy may reduce some of these symptoms and risks. Talk to your health care provider about whether hormone replacement therapy is right for you. Follow these instructions at home:  Schedule regular health, dental, and eye exams.  Stay current with your immunizations.  Do not use any tobacco products including cigarettes, chewing tobacco, or electronic cigarettes.  If you are pregnant, do not drink alcohol.  If you are breastfeeding, limit how much and how often you drink alcohol.  Limit alcohol intake to no more than 1 drink per day for nonpregnant women. One drink equals 12 ounces of beer, 5 ounces of wine, or 1 ounces of hard liquor.  Do not use street drugs.  Do not share needles.  Ask your health care provider for help if you need support or information about quitting drugs.  Tell  your health care provider if you often feel depressed.  Tell your health care provider if you have ever been abused or do not feel safe at home. This information is not intended to replace advice given to you by your health care provider. Make sure you discuss any questions you have with your health care provider. Document Released: 10/08/2010 Document Revised: 08/31/2015 Document Reviewed: 12/27/2014 Elsevier Interactive Patient Education  [employer].

## 2017-03-11 NOTE — Progress Notes (Addendum)
Subjective:   Brandy Bowman is a 66 y.o. female who presents for Medicare Annual (Subsequent) preventive examination.  Review of Systems:  No ROS.  Medicare Wellness Visit. Additional risk factors are reflected in the social history.  Cardiac Risk Factors include: advanced age (>4255men, 8>65 women);dyslipidemia;hypertension;obesity (BMI >30kg/m2);sedentary lifestyle   Sleep patterns: Sleeps 8 hours.  Home Safety/Smoke Alarms: Feels safe in home. Smoke alarms in place.  Living environment; residence and Firearm Safety: Lives with husband in 2 story home.  Seat Belt Safety/Bike Helmet: Wears seat belt.   Female:   Pap-N/A      Mammo-06/08/2014, negative. Declines testing.        Dexa scan-09/26/2009. Declines testing.        CCS-Colonoscopy 04/09/2011, normal.        Objective:     Vitals: BP 132/80 (BP Location: Right Arm, Patient Position: Sitting, Cuff Size: Large)   Pulse 77   SpO2 96%   There is no height or weight on file to calculate BMI.  Advanced Directives 03/11/2017 03/08/2016  Does Patient Have a Medical Advance Directive? No No  Would patient like information on creating a medical advance directive? No - Patient declined Yes (MAU/Ambulatory/Procedural Areas - Information given)    Tobacco Social History   Tobacco Use  Smoking Status Never Smoker  Smokeless Tobacco Never Used     Counseling given: Not Answered     Past Medical History:  Diagnosis Date  . Allergy   . Anxiety   . Arthritis   . Depression   . Hyperlipidemia   . Hypertension   . IFG (impaired fasting glucose)    A1c's all 5.9% or less  . Vitamin D deficiency    Past Surgical History:  Procedure Laterality Date  . TUBAL LIGATION     Family History  Problem Relation Age of Onset  . Dementia Mother   . Pneumonia Father   . Rheum arthritis Paternal Uncle    Social History   Socioeconomic History  . Marital status: Married    Spouse name: None  . Number of children: 2  .  Years of education: None  . Highest education level: None  Social Needs  . Financial resource strain: None  . Food insecurity - worry: None  . Food insecurity - inability: None  . Transportation needs - medical: None  . Transportation needs - non-medical: None  Occupational History  . Occupation: retired Agricultural consultantDA investigator  Tobacco Use  . Smoking status: Never Smoker  . Smokeless tobacco: Never Used  Substance and Sexual Activity  . Alcohol use: No  . Drug use: No  . Sexual activity: No  Other Topics Concern  . 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 Currently lives with husband who is several years older and is independent, but has some forgetfulness. [place] lives in [place]. Does not have contact with son. Enjoys yard work.     Outpatient Encounter Medications as of 03/11/2017  Medication Sig  . cetirizine (ZYRTEC) 10 MG tablet Take 10 mg by mouth as needed.   . Cholecalciferol (VITAMIN D-3 PO) Take 2,000 Int'l Units by mouth daily.  . irbesartan (AVAPRO) 150 MG tablet Take 1 tablet (150 mg total) at bedtime by mouth.  . Multiple Vitamins-Minerals (CENTRUM ULTRA WOMENS PO) Take by mouth daily.  . Naproxen Sodium ([employer] NAPROXEN SODIUM) 220 MG CAPS Take  2 capsules (440 mg total) by mouth 2 (two) times daily.  . Omega-3 Fatty Acids (FISH OIL PO) Take by mouth.  . rosuvastatin (CRESTOR) 10 MG tablet TAKE 1 TABLET BY MOUTH MONDAY THROUGH FRIDAY  . venlafaxine XR (EFFEXOR-XR) 75 MG 24 hr capsule Take 1 capsule (75 mg total) by mouth daily.   No facility-administered encounter medications on file as of 03/11/2017.       Patient Care Team: Natalia LeatherwoodKuneff, Renee A, DO as PCP - General (Family Medicine)    Assessment:    Physical assessment deferred to PCP.  Exercise Activities and Dietary recommendations Current Exercise Habits: The patient does not participate in regular exercise at  present, Exercise limited by: orthopedic condition(s)   Drinks water and tea.   Attempts to eat heart healthy diet.   Goals    . Patient Stated     Maintain current health      Fall Risk Fall Risk  03/11/2017 03/08/2016  Falls in the past year? No No    Depression Screen PHQ 2/9 Scores 03/11/2017 10/08/2016 03/08/2016  PHQ - 2 Score 2 3 0  PHQ- 9 Score 5 4 -     Cognitive Function       Ad8 score reviewed for issues:  Issues making decisions: no  Less interest in hobbies / activities: no  Repeats questions, stories (family complaining): no  Trouble using ordinary gadgets (microwave, computer, phone): no  Forgets the month or year: no  Mismanaging finances: no  Remembering appts: no  Daily problems with thinking and/or memory: no Ad8 score is=0     Immunization History  Administered Date(s) Administered  . Influenza Split 12/28/2009  . Influenza, High Dose Seasonal PF 01/05/2016, 01/20/2017  . Influenza,inj,Quad PF,6+ Mos 01/13/2014, 01/06/2015  . Pneumococcal Conjugate-13 09/07/2015  . Tdap 04/09/2011   Screening Tests Health Maintenance  Topic Date Due  . PNA vac Low Risk Adult (2 of 2 - PPSV23) 09/06/2016  . MAMMOGRAM  11/15/2017 (Originally 06/07/2016)  . COLONOSCOPY  04/08/2021  . TETANUS/TDAP  04/08/2021  . INFLUENZA VACCINE  Completed  . DEXA SCAN  Completed  . Hepatitis C Screening  Completed       Plan:    Bring a copy of your living will and/or healthcare power of attorney to your next office visit.  Continue doing brain stimulating activities (puzzles, reading, adult coloring books, staying active) to keep memory sharp.   I have personally reviewed and noted the following in the patient's chart:   . Medical and social history . Use of alcohol, tobacco or illicit drugs  . Current medications and supplements . Functional ability and status . Nutritional status . Physical activity . Advanced directives . List of other  physicians . Hospitalizations, surgeries, and ER visits in previous 12 months . Vitals . Screenings to include cognitive, depression, and falls . Referrals and appointments  In addition, I have reviewed and discussed with patient certain preventive protocols, quality metrics, and best practice recommendations. A written personalized care plan for preventive services as well as general preventive health recommendations were provided to patient.     Alysia PennaKimberly R Niana Martorana, RN  03/11/2017  PCP Notes: -Declines mammo/dexa testing.  -Prefers to postpone PPSV23 until next appt -PHQ9=5 -F/U with PCP 04/2017  Medical screening examination/treatment/procedure(s) were performed by non-physician practitioner and as supervising physician I was immediately available for consultation/collaboration.  I agree with above assessment and plan.  Electronically Signed by: Felix Pacinienee Kuneff, DO [facility]- [place]

## 2017-04-10 ENCOUNTER — Encounter: Payer: Self-pay | Admitting: Family Medicine

## 2017-04-10 ENCOUNTER — Ambulatory Visit (INDEPENDENT_AMBULATORY_CARE_PROVIDER_SITE_OTHER): Payer: Medicare Other | Admitting: Family Medicine

## 2017-04-10 VITALS — BP 136/84 | HR 79 | Temp 98.0°F | Resp 20 | Ht 65.0 in | Wt 260.5 lb

## 2017-04-10 DIAGNOSIS — F329 Major depressive disorder, single episode, unspecified: Secondary | ICD-10-CM

## 2017-04-10 DIAGNOSIS — E782 Mixed hyperlipidemia: Secondary | ICD-10-CM | POA: Diagnosis not present

## 2017-04-10 DIAGNOSIS — F32A Depression, unspecified: Secondary | ICD-10-CM

## 2017-04-10 DIAGNOSIS — I10 Essential (primary) hypertension: Secondary | ICD-10-CM | POA: Diagnosis not present

## 2017-04-10 DIAGNOSIS — Z23 Encounter for immunization: Secondary | ICD-10-CM | POA: Diagnosis not present

## 2017-04-10 DIAGNOSIS — E7849 Other hyperlipidemia: Secondary | ICD-10-CM | POA: Diagnosis not present

## 2017-04-10 MED ORDER — VENLAFAXINE HCL ER 75 MG PO CP24: 75.0000 mg | ORAL_CAPSULE | Freq: Every day | ORAL | 1 refills | Status: DC

## 2017-04-10 MED ORDER — ROSUVASTATIN CALCIUM 10 MG PO TABS
ORAL_TABLET | ORAL | 6 refills | Status: DC
Start: 2017-04-10 — End: 2017-10-13

## 2017-04-10 MED ORDER — IRBESARTAN 150 MG PO TABS: 150.0000 mg | ORAL_TABLET | Freq: Every day | ORAL | 1 refills | Status: DC

## 2017-04-10 NOTE — Patient Instructions (Signed)
Your blood pressure is ok. Could be a little better control. Watch your sodium. Try exercising to what you can tolerate.   Monitor BP at home on a few times a month. If routinely above 135/85 please be seen, we would want to adjust medications.    Followup 6 months.    Please help [place] help you:  We are honored you have chosen [facility] for your Primary Care home. Below you will find basic instructions that you may need to access in the future. Please help [place] help you by reading the instructions, which cover many of the frequent questions we experience.   Prescription refills and request:  -In order to allow more efficient response time, please call your pharmacy for all refills. They will forward the request electronically to [place]. This allows for the quickest possible response. Request left on a nurse line can take longer to refill, since these are checked as time allows between office patients and other phone calls.  - refill request can take up to 3-5 working days to complete.  - If request is sent electronically and request is appropiate, it is usually completed in 1-2 business days.  - all patients will need to be seen routinely for all chronic medical conditions requiring prescription medications (see follow-up below). If you are overdue for follow up on your condition, you will be asked to make an appointment and we will call in enough medication to cover you until your appointment (up to 30 days).  - all controlled substances will require a face to face visit to request/refill.  - if you desire your prescriptions to go through a new pharmacy, and have an active script at original pharmacy, you will need to call your pharmacy and have scripts transferred to new pharmacy. This is completed between the pharmacy locations and not by your provider.    Results: If any images or labs were ordered, it can take up to 1 week to get results depending on the test ordered and the lab/facility  running and resulting the test. - Normal or stable results, which do not need further discussion, may be released to your mychart immediately with attached note to you. A call may not be generated for normal results. Please make certain to sign up for mychart. If you have questions on how to activate your mychart you can call the front office.  - If your results need further discussion, our office will attempt to contact you via phone, and if unable to reach you after 2 attempts, we will release your abnormal result to your mychart with instructions.  - All results will be automatically released in mychart after 1 week.  - Your provider will provide you with explanation and instruction on all relevant material in your results. Please keep in mind, results and labs may appear confusing or abnormal to the untrained eye, but it does not mean they are actually abnormal for you personally. If you have any questions about your results that are not covered, or you desire more detailed explanation than what was provided, you should make an appointment with your provider to do so.   Our office handles many outgoing and incoming calls daily. If we have not contacted you within 1 week about your results, please check your mychart to see if there is a message first and if not, then contact our office.  In helping with this matter, you help decrease call volume, and therefore allow [place] to be able to  respond to patients needs more efficiently.   Acute office visits (sick visit):  An acute visit is intended for a new problem and are scheduled in shorter time slots to allow schedule openings for patients with new problems. This is the appropriate visit to discuss a new problem. In order to provide you with excellent quality medical care with proper time for you to explain your problem, have an exam and receive treatment with instructions, these appointments should be limited to one new problem per visit. If you experience a  new problem, in which you desire to be addressed, please make an acute office visit, we save openings on the schedule to accommodate you. Please do not save your new problem for any other type of visit, let us take care of it properly and quickly for you.   Follow up visits:  Depending on your condition(s) your provider will need to see you routinely in order to provide you with quality care and prescribe medication(s). Most chronic conditions (Example: hypertension, Diabetes, depression/anxiety... etc), require visits a couple times a year. Your provider will instruct you on proper follow up for your personal medical conditions and history. Please make certain to make follow up appointments for your condition as instructed. Failing to do so could result in lapse in your medication treatment/refills. If you request a refill, and are overdue to be seen on a condition, we will always provide you with a 30 day script (once) to allow you time to schedule.    Medicare wellness (well visit): - we have a wonderful Nurse Selena Batten(Kim), that will meet with you and provide you will yearly medicare wellness visits. These visits should occur yearly (can not be scheduled less than 1 calendar year apart) and cover preventive health, immunizations, advance directives and screenings you are entitled to yearly through your medicare benefits. Do not miss out on your entitled benefits, this is when medicare will pay for these benefits to be ordered for you.  These are strongly encouraged by your provider and is the appropriate type of visit to make certain you are up to date with all preventive health benefits. If you have not had your medicare wellness exam in the last 12 months, please make certain to schedule one by calling the office and schedule your medicare wellness with Selena BattenKim as soon as possible.   Yearly physical (well visit):  - Adults are recommended to be seen yearly for physicals. Check with your insurance and date of  your last physical, most insurances require one calendar year between physicals. Physicals include all preventive health topics, screenings, medical exam and labs that are appropriate for gender/age and history. You may have fasting labs needed at this visit. This is a well visit (not a sick visit), new problems should not be covered during this visit (see acute visit).  - Pediatric patients are seen more frequently when they are younger. Your provider will advise you on well child visit timing that is appropriate for your their age. - This is not a medicare wellness visit. Medicare wellness exams do not have an exam portion to the visit. Some medicare companies allow for a physical, some do not allow a yearly physical. If your medicare allows a yearly physical you can schedule the medicare wellness with our nurse Selena BattenKim and have your physical with your provider after, on the same day. Please check with insurance for your full benefits.   Late Policy/No Shows:  - all new patients should arrive 15-30 minutes earlier  than appointment to allow [place] time  to  obtain all personal demographics,  insurance information and for you to complete office paperwork. - All established patients should arrive 10-15 minutes earlier than appointment time to update all information and be checked in .  - In our best efforts to run on time, if you are late for your appointment you will be asked to either reschedule or if able, we will work you back into the schedule. There will be a wait time to work you back in the schedule,  depending on availability.  - If you are unable to make it to your appointment as scheduled, please call 24 hours ahead of time to allow [place] to fill the time slot with someone else who needs to be seen. If you do not cancel your appointment ahead of time, you may be charged a no show fee.

## 2017-04-10 NOTE — Progress Notes (Signed)
Brandy Bowman , 11/02/1950, 67 y.o., female MRN: [id number] Patient Care Team    Relationship Specialty Notifications Start End  Brandy Bowman, Brandy Leverich A, DO PCP - General Family Medicine  03/08/16     Chief Complaint  Patient presents with  . Hypertension  . Hyperlipidemia  . Depression     Subjective:  Essential hypertension, benign//hyperlipidemia:   Pt reports compliance with Crestor 10 mg, fish oil supplement and irbesartan 150mg . Blood pressures ranges at home are WNL. Patient denies chest pain, shortness of breath, dizziness or lower extremity edema. . Pt is not on  ASA 2/2 daily naproxen use. Pt is prescribed statin. BMP: 10/08/2016 WNL CBC: 10/08/2016 WNL Lipid panel: 10/08/2016 Total 1607 HDL 45, LDL 89, tg 166 Microalbuminuria: 2.7 06/24/2014--> now on Bowman sartan Diet: Low sodium,  Very little meat consumption  Exercise: Not exercising routinely.  RF: HTN, HLD, No Fhx     Depressive disorder:  Pt is still doing well on effexor 75 mg QD.She denies any side effects on medication. She feels her anxiety is well controlled. PHQ and GAD good today.  [facility] 04/10/2017 03/11/2017 10/08/2016 03/08/2016  Decreased Interest 0 1 0 0  Down, Depressed, Hopeless 1 1 3  0  PHQ - 2 Score 1 2 3  0  Altered sleeping 0 0 0 -  Tired, decreased energy 1 2 1  -  Change in appetite 0 0 0 -  Feeling bad or failure about yourself  0 1 0 -  Trouble concentrating 0 0 0 -  Moving slowly or fidgety/restless 0 0 0 -  Suicidal thoughts 0 0 0 -  PHQ-9 Score 2 5 4  -  Difficult doing work/chores - Not difficult at all - -    No Known Allergies Social History   Tobacco Use  . Smoking status: Never Smoker  . Smokeless tobacco: Never Used  Substance Use Topics  . Alcohol use: No   Past Medical History:  Diagnosis Date  . Allergy   . Anxiety   . Arthritis   . Depression   . Hyperlipidemia   . Hypertension   . IFG (impaired fasting glucose)    A1c's all 5.9% or less  . Vitamin D  deficiency    Past Surgical History:  Procedure Laterality Date  . TUBAL LIGATION     Family History  Problem Relation Age of Onset  . Dementia Mother   . Pneumonia Father   . Rheum arthritis Paternal Uncle    Allergies as of 04/10/2017   No Known Allergies     Medication List        Accurate as of 04/10/17  2:32 PM. Always use your most recent med list.          CENTRUM ULTRA WOMENS PO Take by mouth daily.   cetirizine 10 MG tablet Commonly known as:  ZYRTEC Take 10 mg by mouth as needed.   FISH OIL PO Take by mouth.   irbesartan 150 MG tablet Commonly known as:  AVAPRO Take 1 tablet (150 mg total) by mouth at bedtime.   Naproxen Sodium 220 MG Caps Commonly known as:  [employer] NAPROXEN SODIUM Take 2 capsules (440 mg total) by mouth 2 (two) times daily.   rosuvastatin 10 MG tablet Commonly known as:  CRESTOR TAKE 1 TABLET BY MOUTH MONDAY THROUGH FRIDAY   venlafaxine XR 75 MG 24 hr capsule Commonly known as:  EFFEXOR-XR Take 1 capsule (75 mg total) by mouth daily.   VITAMIN D-3  PO Take 2,000 Int'l Units by mouth daily.       All past medical history, surgical history, allergies, family history, immunizations andmedications were updated in the EMR today and reviewed under the history and medication portions of their EMR.     ROS: Negative, with the exception of above mentioned in HPI   Objective:  BP 136/84 (BP Location: Left Arm, Patient Position: Sitting, Cuff Size: Large)   Pulse 79   Temp 98 F (36.7 C)   Resp 20   Ht 5\' 5"  (1.651 m)   Wt 260 lb 8 oz (118.2 kg)   SpO2 97%   BMI 43.35 kg/m  Body mass index is 43.35 kg/m. Gen: Afebrile. No acute distress. Nontoxic in appearance, well developed, well nourished. Pleasant Caucasian female. HENT: AT. . MMM, no oral lesions.  Eyes:Pupils Equal Round Reactive to light, Extraocular movements intact,  Conjunctiva without redness, discharge or icterus. CV: RRR no murmur, no edema Chest: CTAB, no wheeze or  crackles. Good air movement, normal resp effort.  Abd: Soft. NTND. BS present.  Neuro:  Normal gait. PERLA. EOMi. Alert. Oriented x3  Psych: Normal affect, dress and demeanor. Normal speech. Normal thought content and judgment.  No exam data present No results found. No results found for this or any previous visit (from the past 24 hour(s)).  Assessment/Plan: Brandy Bowman is Bowman 67 y.o. female present for OV for  Essential hypertension, benign/HLD -  stable, could use Bowman little more coverage. Patient will monitor and outpatient setting consistently above 135/85 will schedule earlier follow-up. - Watch added salt and exercise > 150 minutes Bowman week  - Continue Irbesartan 150 mg Qd - BMP yearly: UTD 10/2016 - Lipid UTD 2018, controlled on low dose crestor. Continue crestor 10 mg QD, refills provided today.  - microalbuminuria present 06/2014, pt is on Bowman -sartan.  - No ASA use --> takes daily naproxen - F/U 6 months. If again borderline or patient calls in with elevated blood pressures, would increase irbesartan.  Depressive disorder -  stable. Doing well on current dose.  - continue effexor 75 mg.  - f/u q 6 months.   Pneumovax 23 provided today. Patient wanted to wait until today to have completed.  Reviewed expectations re: course of current medical issues.  Discussed self-management of symptoms.  Outlined signs and symptoms indicating need for more acute intervention.  Patient verbalized understanding and all questions were answered.  Patient received an After-Visit Summary.    Orders Placed This Encounter  Procedures  . Pneumococcal polysaccharide vaccine 23-valent greater than or equal to 2yo subcutaneous/IM     Note is dictated utilizing voice recognition software. Although note has been proof read prior to signing, occasional typographical errors still can be missed. If any questions arise, please do not hesitate to call for verification.   electronically signed  by:  Brandy Pacini, DO  [facility] [place]

## 2017-10-07 ENCOUNTER — Ambulatory Visit: Payer: Self-pay | Admitting: Family Medicine

## 2017-10-13 ENCOUNTER — Encounter: Payer: Self-pay | Admitting: Family Medicine

## 2017-10-13 ENCOUNTER — Telehealth: Payer: Self-pay | Admitting: Family Medicine

## 2017-10-13 ENCOUNTER — Ambulatory Visit (INDEPENDENT_AMBULATORY_CARE_PROVIDER_SITE_OTHER): Payer: Medicare Other | Admitting: Family Medicine

## 2017-10-13 VITALS — BP 138/73 | HR 81 | Resp 16 | Ht 65.0 in | Wt 260.0 lb

## 2017-10-13 DIAGNOSIS — E782 Mixed hyperlipidemia: Secondary | ICD-10-CM

## 2017-10-13 DIAGNOSIS — F329 Major depressive disorder, single episode, unspecified: Secondary | ICD-10-CM

## 2017-10-13 DIAGNOSIS — I10 Essential (primary) hypertension: Secondary | ICD-10-CM | POA: Diagnosis not present

## 2017-10-13 DIAGNOSIS — F32A Depression, unspecified: Secondary | ICD-10-CM

## 2017-10-13 DIAGNOSIS — E7849 Other hyperlipidemia: Secondary | ICD-10-CM | POA: Diagnosis not present

## 2017-10-13 LAB — CBC WITH DIFFERENTIAL/PLATELET
BASOS ABS: 0.1 10*3/uL (ref 0.0–0.1)
Basophils Relative: 0.9 % (ref 0.0–3.0)
EOS ABS: 0.3 10*3/uL (ref 0.0–0.7)
Eosinophils Relative: 3.3 % (ref 0.0–5.0)
HEMATOCRIT: 41.8 % (ref 36.0–46.0)
HEMOGLOBIN: 14 g/dL (ref 12.0–15.0)
LYMPHS PCT: 26.4 % (ref 12.0–46.0)
Lymphs Abs: 2.1 10*3/uL (ref 0.7–4.0)
MCHC: 33.5 g/dL (ref 30.0–36.0)
MCV: 90.9 fl (ref 78.0–100.0)
MONOS PCT: 5.6 % (ref 3.0–12.0)
Monocytes Absolute: 0.4 10*3/uL (ref 0.1–1.0)
NEUTROS ABS: 5.1 10*3/uL (ref 1.4–7.7)
Neutrophils Relative %: 63.8 % (ref 43.0–77.0)
PLATELETS: 277 10*3/uL (ref 150.0–400.0)
RBC: 4.6 Mil/uL (ref 3.87–5.11)
RDW: 13.5 % (ref 11.5–15.5)
WBC: 8 10*3/uL (ref 4.0–10.5)

## 2017-10-13 LAB — COMPREHENSIVE METABOLIC PANEL
ALK PHOS: 76 U/L (ref 39–117)
ALT: 28 U/L (ref 0–35)
AST: 28 U/L (ref 0–37)
Albumin: 4.2 g/dL (ref 3.5–5.2)
BUN: 12 mg/dL (ref 6–23)
CALCIUM: 9.2 mg/dL (ref 8.4–10.5)
CO2: 27 meq/L (ref 19–32)
Chloride: 106 mEq/L (ref 96–112)
Creatinine, Ser: 0.57 mg/dL (ref 0.40–1.20)
GFR: 112.25 mL/min (ref >60.00)
GLUCOSE: 108 mg/dL — AB (ref 70–99)
Potassium: 4.2 mEq/L (ref 3.5–5.1)
Sodium: 142 mEq/L (ref 135–145)
TOTAL PROTEIN: 6.8 g/dL (ref 6.0–8.3)
Total Bilirubin: 0.6 mg/dL (ref 0.2–1.2)

## 2017-10-13 LAB — TSH: TSH: 2.86 u[IU]/mL (ref 0.35–4.50)

## 2017-10-13 LAB — LIPID PANEL
CHOL/HDL RATIO: 3
Cholesterol: 167 mg/dL (ref 0–200)
HDL: 47.8 mg/dL (ref >39.00)
LDL Cholesterol: 88 mg/dL (ref 0–99)
NONHDL: 118.99
TRIGLYCERIDES: 155 mg/dL — AB (ref 0.0–149.0)
VLDL: 31 mg/dL (ref 0.0–40.0)

## 2017-10-13 MED ORDER — VENLAFAXINE HCL ER 75 MG PO CP24: 75.0000 mg | ORAL_CAPSULE | Freq: Every day | ORAL | 1 refills | Status: DC

## 2017-10-13 MED ORDER — ROSUVASTATIN CALCIUM 10 MG PO TABS: ORAL_TABLET | ORAL | 6 refills | Status: DC

## 2017-10-13 MED ORDER — IRBESARTAN 150 MG PO TABS: 225.0000 mg | ORAL_TABLET | Freq: Every day | ORAL | 1 refills | Status: DC

## 2017-10-13 NOTE — Patient Instructions (Signed)
Increase irbesartan to 225 mg a day (1.5 pills)  Rest of meds also called in for you today.   You look great, we will call you  With your labs.    Please help us help you:  [facility]. Below you will find basic instructions that you may need to access in the future. Please help us help you by reading the instructions, which cover many of the frequent questions we experience.   Prescription refills and request:  -In order to allow more efficient response time, please call your pharmacy for all refills. They will forward the request electronically to us. This allows for the quickest possible response. Request left on a nurse line can take longer to refill, since these are checked as time allows between office patients and other phone calls.  - refill request can take up to 3-5 working days to complete.  - If request is sent electronically and request is appropiate, it is usually completed in 1-2 business days.  - all patients will need to be seen routinely for all chronic medical conditions requiring prescription medications (see follow-up below). If you are overdue for follow up on your condition, you will be asked to make an appointment and we will call in enough medication to cover you until your appointment (up to 30 days).  - all controlled substances will require a face to face visit to request/refill.  - if you desire your prescriptions to go through a new pharmacy, and have an active script at original pharmacy, you will need to call your pharmacy and have scripts transferred to new pharmacy. This is completed between the pharmacy locations and not by your provider.    Results: If any images or labs were ordered, it can take up to 1 week to get results depending on the test ordered and the lab/facility running and resulting the test. - Normal or stable results, which do not need further discussion, may be released to your  mychart immediately with attached note to you. A call may not be generated for normal results. Please make certain to sign up for mychart. If you have questions on how to activate your mychart you can call the front office.  - If your results need further discussion, our office will attempt to contact you via phone, and if unable to reach you after 2 attempts, we will release your abnormal result to your mychart with instructions.  - All results will be automatically released in mychart after 1 week.  - Your provider will provide you with explanation and instruction on all relevant material in your results. Please keep in mind, results and labs may appear confusing or abnormal to the untrained eye, but it does not mean they are actually abnormal for you personally. If you have any questions about your results that are not covered, or you desire more detailed explanation than what was provided, you should make an appointment with your provider to do so.   Our office handles many outgoing and incoming calls daily. If we have not contacted you within 1 week about your results, please check your mychart to see if there is a message first and if not, then contact our office.  In helping with this matter, you help decrease call volume, and therefore allow us to be able to respond to patients needs more efficiently.   Acute office visits (sick visit):  An acute visit is intended for  a new problem and are scheduled in shorter time slots to allow schedule openings for patients with new problems. This is the appropriate visit to discuss a new problem. Problems will not be addressed by phone call or Echart message. Appointment is needed if requesting treatment. In order to provide you with excellent quality medical care with proper time for you to explain your problem, have an exam and receive treatment with instructions, these appointments should be limited to one new problem per visit. If you experience a new  problem, in which you desire to be addressed, please make an acute office visit, we save openings on the schedule to accommodate you. Please do not save your new problem for any other type of visit, let us take care of it properly and quickly for you.   Follow up visits:  Depending on your condition(s) your provider will need to see you routinely in order to provide you with quality care and prescribe medication(s). Most chronic conditions (Example: hypertension, Diabetes, depression/anxiety... etc), require visits a couple times a year. Your provider will instruct you on proper follow up for your personal medical conditions and history. Please make certain to make follow up appointments for your condition as instructed. Failing to do so could result in lapse in your medication treatment/refills. If you request a refill, and are overdue to be seen on a condition, we will always provide you with a 30 day script (once) to allow you time to schedule.    Medicare wellness (well visit): - we have a wonderful Nurse Maudie Mercury), that will meet with you and provide you will yearly medicare wellness visits. These visits should occur yearly (can not be scheduled less than 1 calendar year apart) and cover preventive health, immunizations, advance directives and screenings you are entitled to yearly through your medicare benefits. Do not miss out on your entitled benefits, this is when medicare will pay for these benefits to be ordered for you.  These are strongly encouraged by your provider and is the appropriate type of visit to make certain you are up to date with all preventive health benefits. If you have not had your medicare wellness exam in the last 12 months, please make certain to schedule one by calling the office and schedule your medicare wellness with Maudie Mercury as soon as possible.   Yearly physical (well visit):  - Adults are recommended to be seen yearly for physicals. Check with your insurance and date of your  last physical, most insurances require one calendar year between physicals. Physicals include all preventive health topics, screenings, medical exam and labs that are appropriate for gender/age and history. You may have fasting labs needed at this visit. This is a well visit (not a sick visit), new problems should not be covered during this visit (see acute visit).  - Pediatric patients are seen more frequently when they are younger. Your provider will advise you on well child visit timing that is appropriate for your their age. - This is not a medicare wellness visit. Medicare wellness exams do not have an exam portion to the visit. Some medicare companies allow for a physical, some do not allow a yearly physical. If your medicare allows a yearly physical you can schedule the medicare wellness with our nurse Maudie Mercury and have your physical with your provider after, on the same day. Please check with insurance for your full benefits.   Late Policy/No Shows:  - all new patients should arrive 15-30 minutes earlier than appointment to  allow [place] time  to  obtain all personal demographics,  insurance information and for you to complete office paperwork. - All established patients should arrive 10-15 minutes earlier than appointment time to update all information and be checked in .  - In our best efforts to run on time, if you are late for your appointment you will be asked to either reschedule or if able, we will work you back into the schedule. There will be a wait time to work you back in the schedule,  depending on availability.  - If you are unable to make it to your appointment as scheduled, please call 24 hours ahead of time to allow [place] to fill the time slot with someone else who needs to be seen. If you do not cancel your appointment ahead of time, you may be charged a no show fee.

## 2017-10-13 NOTE — Progress Notes (Signed)
Brandy Bowman , 05/17/1950, 67 y.o., female MRN: [id number] Patient Care Team    Relationship Specialty Notifications Start End  Natalia LeatherwoodKuneff, Renee A, DO PCP - General Family Medicine  03/08/16     Chief Complaint  Patient presents with  . Follow-up    HTN     Subjective:  Essential hypertension, benign//hyperlipidemia/morbid obesity:   Pt reports compliance with Crestor 10 mg, fish oil supplement and irbesartan 150mg . Blood pressures ranges at home are WNL. Patient denies chest pain, shortness of breath, dizziness or lower extremity edema.   Pt is not on  ASA 2/2 daily naproxen use. Pt is prescribed statin. BMP: 10/13/2017 within normal limits CBC: 10/13/2017 within normal limits Lipid panel: 04/15/2017 total cholesterol 167, LDL 88, HDL 47, triglycerides 155 TSH: 10/13/2017 2.86 Microalbuminuria: 2.7 06/24/2014--> now on a sartan Diet: Low sodium,  Very little meat consumption  Exercise: Not exercising routinely.  RF: HTN, HLD, No Fhx    Depressive disorder:  Pt is doing well on effexor 75 mg QD.She denies any side effects on medication. She feels her anxiety is well controlled. PHQ and GAD good today.  [facility] 04/10/2017 03/11/2017 10/08/2016 03/08/2016  Decreased Interest 0 1 0 0  Down, Depressed, Hopeless 1 1 3  0  PHQ - 2 Score 1 2 3  0  Altered sleeping 0 0 0 -  Tired, decreased energy 1 2 1  -  Change in appetite 0 0 0 -  Feeling bad or failure about yourself  0 1 0 -  Trouble concentrating 0 0 0 -  Moving slowly or fidgety/restless 0 0 0 -  Suicidal thoughts 0 0 0 -  PHQ-9 Score 2 5 4  -  Difficult doing work/chores - Not difficult at all - -    No Known Allergies Social History   Tobacco Use  . Smoking status: Never Smoker  . Smokeless tobacco: Never Used  Substance Use Topics  . Alcohol use: No   Past Medical History:  Diagnosis Date  . Allergy   . Anxiety   . Arthritis   . Depression   . Hyperlipidemia   . Hypertension   . IFG (impaired fasting  glucose)    A1c's all 5.9% or less  . Vitamin D deficiency    Past Surgical History:  Procedure Laterality Date  . TUBAL LIGATION     Family History  Problem Relation Age of Onset  . Dementia Mother   . Pneumonia Father   . Rheum arthritis Paternal Uncle    Allergies as of 10/13/2017   No Known Allergies     Medication List        Accurate as of 10/13/17  9:18 AM. Always use your most recent med list.          CENTRUM ULTRA WOMENS PO Take by mouth daily.   cetirizine 10 MG tablet Commonly known as:  ZYRTEC Take 10 mg by mouth as needed.   FISH OIL PO Take by mouth.   irbesartan 150 MG tablet Commonly known as:  AVAPRO Take 1 tablet (150 mg total) by mouth at bedtime.   Naproxen Sodium 220 MG Caps Commonly known as:  CVS NAPROXEN SODIUM Take 2 capsules (440 mg total) by mouth 2 (two) times daily.   rosuvastatin 10 MG tablet Commonly known as:  CRESTOR TAKE 1 TABLET BY MOUTH MONDAY THROUGH FRIDAY   venlafaxine XR 75 MG 24 hr capsule Commonly known as:  EFFEXOR-XR Take 1 capsule (75 mg total) by  mouth daily.   VITAMIN D-3 PO Take 2,000 Int'l Units by mouth daily.       All past medical history, surgical history, allergies, family history, immunizations andmedications were updated in the EMR today and reviewed under the history and medication portions of their EMR.     ROS: Negative, with the exception of above mentioned in HPI   Objective:  BP 138/73 (BP Location: Left Arm, Patient Position: Sitting, Cuff Size: Large)   Pulse 81   Resp 16   Ht 5\' 5"  (1.651 m)   Wt 260 lb (117.9 kg)   SpO2 97%   BMI 43.27 kg/m  Body mass index is 43.27 kg/m. Gen: Afebrile. No acute distress.  Nontoxic and presentation.  Very pleasant Caucasian female.  Obese. HENT: AT. [place].  MMM.  Eyes:Pupils Equal Round Reactive to light, Extraocular movements intact,  Conjunctiva without redness, discharge or icterus. Neck/lymp/endocrine: Supple, no lymphadenopathy, no  thyromegaly CV: RRR murmur, no edema, +2/4 P posterior tibialis pulses, no carotid bruit Chest: CTAB, no wheeze or crackles Abd: Soft.  Obese. NTND. BS positive.  No masses palpated.  Skin: no rashes, purpura or petechiae.  Neuro:  Normal gait. PERLA. EOMi. Alert. Oriented x3  Psych: Normal affect, dress and demeanor. Normal speech. Normal thought content and judgment.  No exam data present No results found. No results found for this or any previous visit (from the past 24 hour(s)).  Assessment/Plan: Mariem Bowman is a 67 y.o. female present for OV for  Essential hypertension, benign/HLD/morbid obesity - Blood pressures continue to be just little over goal.  Discussed adjusting medications and she is agreeable with that today.  Increase irbesartan to 225 mg (1.5 tabs )  - Watch added salt and exercise > 150 minutes a week  - BMP yearly: UTD 10/2017 - Lipid UTD 2019, controlled on low dose crestor. Continue crestor 10 mg QD, refills provided today. - microalbuminuria present 06/2014, pt is on a -sartan.  - No ASA use --> takes daily naproxen - F/U 6 months.  Unless needed sooner  Depressive disorder - Stable. Doing well on current dose.  - continue effexor 75 mg.  Refills provided today. - f/u q 6 months.     Reviewed expectations re: course of current medical issues.  Discussed self-management of symptoms.  Outlined signs and symptoms indicating need for more acute intervention.  Patient verbalized understanding and all questions were answered.  Patient received an After-Visit Summary.    No orders of the defined types were placed in this encounter.    Note is dictated utilizing voice recognition software. Although note has been proof read prior to signing, occasional typographical errors still can be missed. If any questions arise, please do not hesitate to call for verification.   electronically signed by:  Felix Pacini, DO  [facility] [place]

## 2017-10-13 NOTE — Telephone Encounter (Signed)
Please inform patient the following information: Her labs look gorgeous. Her triglycerides are barely elevated, better than last year. She could increase her fiber and fish oil supplement dose to help decrease more.

## 2017-10-14 NOTE — Telephone Encounter (Signed)
Detailed message left on voice mail. Okay per DPR. 

## 2017-10-16 ENCOUNTER — Encounter: Payer: Self-pay | Admitting: Family Medicine

## 2018-01-28 ENCOUNTER — Ambulatory Visit (INDEPENDENT_AMBULATORY_CARE_PROVIDER_SITE_OTHER): Payer: Medicare Other

## 2018-01-28 DIAGNOSIS — Z23 Encounter for immunization: Secondary | ICD-10-CM | POA: Diagnosis not present

## 2018-04-16 ENCOUNTER — Encounter: Payer: Self-pay | Admitting: Family Medicine

## 2018-04-16 ENCOUNTER — Ambulatory Visit (INDEPENDENT_AMBULATORY_CARE_PROVIDER_SITE_OTHER): Payer: Medicare Other | Admitting: Family Medicine

## 2018-04-16 VITALS — BP 132/82 | HR 98 | Temp 98.5°F | Resp 16 | Ht 64.0 in | Wt 261.0 lb

## 2018-04-16 DIAGNOSIS — I10 Essential (primary) hypertension: Secondary | ICD-10-CM

## 2018-04-16 DIAGNOSIS — Z5320 Procedure and treatment not carried out because of patient's decision for unspecified reasons: Secondary | ICD-10-CM | POA: Diagnosis not present

## 2018-04-16 DIAGNOSIS — E7849 Other hyperlipidemia: Secondary | ICD-10-CM | POA: Diagnosis not present

## 2018-04-16 DIAGNOSIS — E782 Mixed hyperlipidemia: Secondary | ICD-10-CM

## 2018-04-16 DIAGNOSIS — F32A Depression, unspecified: Secondary | ICD-10-CM

## 2018-04-16 DIAGNOSIS — R7309 Other abnormal glucose: Secondary | ICD-10-CM | POA: Diagnosis not present

## 2018-04-16 DIAGNOSIS — F329 Major depressive disorder, single episode, unspecified: Secondary | ICD-10-CM | POA: Diagnosis not present

## 2018-04-16 DIAGNOSIS — Z0000 Encounter for general adult medical examination without abnormal findings: Secondary | ICD-10-CM

## 2018-04-16 DIAGNOSIS — Z0001 Encounter for general adult medical examination with abnormal findings: Secondary | ICD-10-CM

## 2018-04-16 LAB — COMPREHENSIVE METABOLIC PANEL
ALBUMIN: 4.2 g/dL (ref 3.5–5.2)
ALK PHOS: 81 U/L (ref 39–117)
ALT: 23 U/L (ref 0–35)
AST: 22 U/L (ref 0–37)
BILIRUBIN TOTAL: 0.6 mg/dL (ref 0.2–1.2)
BUN: 14 mg/dL (ref 6–23)
CALCIUM: 9.3 mg/dL (ref 8.4–10.5)
CO2: 29 mEq/L (ref 19–32)
Chloride: 103 mEq/L (ref 96–112)
Creatinine, Ser: 0.59 mg/dL (ref 0.40–1.20)
GFR: 107.71 mL/min (ref >60.00)
Glucose, Bld: 107 mg/dL — ABNORMAL HIGH (ref 70–99)
POTASSIUM: 4.1 meq/L (ref 3.5–5.1)
Sodium: 141 mEq/L (ref 135–145)
TOTAL PROTEIN: 6.8 g/dL (ref 6.0–8.3)

## 2018-04-16 LAB — CBC
HCT: 42.4 % (ref 36.0–46.0)
HEMOGLOBIN: 14 g/dL (ref 12.0–15.0)
MCHC: 32.9 g/dL (ref 30.0–36.0)
MCV: 90.6 fl (ref 78.0–100.0)
PLATELETS: 288 10*3/uL (ref 150.0–400.0)
RBC: 4.68 Mil/uL (ref 3.87–5.11)
RDW: 13.3 % (ref 11.5–15.5)
WBC: 8 10*3/uL (ref 4.0–10.5)

## 2018-04-16 LAB — LIPID PANEL
CHOLESTEROL: 163 mg/dL (ref 0–200)
HDL: 48.4 mg/dL (ref >39.00)
LDL Cholesterol: 85 mg/dL (ref 0–99)
NonHDL: 114.28
TRIGLYCERIDES: 146 mg/dL (ref 0.0–149.0)
Total CHOL/HDL Ratio: 3
VLDL: 29.2 mg/dL (ref 0.0–40.0)

## 2018-04-16 LAB — HEMOGLOBIN A1C: Hgb A1c MFr Bld: 6 % (ref 4.6–6.5)

## 2018-04-16 MED ORDER — ROSUVASTATIN CALCIUM 10 MG PO TABS: ORAL_TABLET | ORAL | 6 refills | Status: DC

## 2018-04-16 MED ORDER — VENLAFAXINE HCL ER 75 MG PO CP24: 75.0000 mg | ORAL_CAPSULE | Freq: Every day | ORAL | 1 refills | Status: DC

## 2018-04-16 MED ORDER — IRBESARTAN 150 MG PO TABS: 225.0000 mg | ORAL_TABLET | Freq: Every day | ORAL | 1 refills | Status: DC

## 2018-04-16 NOTE — Progress Notes (Addendum)
Brandy Bowman , 05-23-1950, 68 y.o., female MRN: [id number] Patient Care Team    Relationship Specialty Notifications Start End  Ma Hillock, DO PCP - General Family Medicine  03/08/16     Chief Complaint  Patient presents with  . Annual Exam    Pt is Fasting     Subjective:  Brandy Bowman is a 68 y.o. female present for CPE and CMC.  Health maintenance:  Colonoscopy:  04/09/2011- [facility].  Mammogram: Patient declined mammogram. Last 2016- pt reports "they are always ok." Immunizations: Tetanus UTD 04/2011, influenza UTD 01/2018, pneumonia series completed.  Shingrix - pt has tried to get at a pharmacy but they have been out- she has not checked lately- she will try again.  Infectious disease screening: Hep C completed  Essential hypertension, benign//hyperlipidemia/morbid obesity:   Pt reports compliance with Crestor 10 mg, fish oil supplement and irbesartan 175m. Blood pressures ranges at home are WNL. Patient denies chest pain, shortness of breath, dizziness or lower extremity edema.   Pt is not on  ASA 2/2 daily naproxen use. Pt is prescribed statin. BMP: 10/13/2017 within normal limits CBC: 10/13/2017 within normal limits Lipid panel: 04/15/2017 total cholesterol 167, LDL 88, HDL 47, triglycerides 155 TSH: 10/13/2017 2.86 Microalbuminuria: 2.7 06/24/2014--> now on a sartan Diet: Low sodium,  Very little meat consumption  Exercise: Not exercising routinely.  RF: HTN, HLD, No Fhx    Depressive disorder:  Pt is doing okay on effexor 75 mg QD.She denies any side effects on medication.  She is under more stress lately.  Her stress surrounds the assistance and care for her husband.   [facility] 04/16/2018 04/10/2017 03/11/2017 10/08/2016 03/08/2016  Decreased Interest 1 0 1 0 0  Down, Depressed, Hopeless 1 1 1 3  0  PHQ - 2 Score 2 1 2 3  0  Altered sleeping 0 0 0 0 -  Tired, decreased energy 2 1 2 1  -  Change in appetite 0 0 0 0 -  Feeling bad or failure about  yourself  0 0 1 0 -  Trouble concentrating 0 0 0 0 -  Moving slowly or fidgety/restless 0 0 0 0 -  Suicidal thoughts 0 0 0 0 -  PHQ-9 Score 4 2 5 4  -  Difficult doing work/chores Not difficult at all - Not difficult at all - -   GAD 7 : Generalized Anxiety Score 04/16/2018  Nervous, Anxious, on Edge 1  Control/stop worrying 1  Worry too much - different things 1  Trouble relaxing 0  Restless 0  Easily annoyed or irritable 2  Afraid - awful might happen 0  Total GAD 7 Score 5  Anxiety Difficulty Not difficult at all    No Known Allergies Social History   Tobacco Use  . Smoking status: Never Smoker  . Smokeless tobacco: Never Used  Substance Use Topics  . Alcohol use: No   Past Medical History:  Diagnosis Date  . Allergy   . Anxiety   . Arthritis   . Depression   . Hyperlipidemia   . Hypertension   . IFG (impaired fasting glucose)    A1c's all 5.9% or less  . Vitamin D deficiency    Past Surgical History:  Procedure Laterality Date  . TUBAL LIGATION     Family History  Problem Relation Age of Onset  . Dementia Mother   . Pneumonia Father   . Rheum arthritis Paternal Uncle    Allergies as of  04/16/2018   No Known Allergies     Medication List       Accurate as of April 16, 2018  1:43 PM. Always use your most recent med list.        CENTRUM ULTRA WOMENS PO Take by mouth daily.   cetirizine 10 MG tablet Commonly known as:  ZYRTEC Take 10 mg by mouth as needed.   FISH OIL PO Take by mouth.   irbesartan 150 MG tablet Commonly known as:  AVAPRO Take 1.5 tablets (225 mg total) by mouth at bedtime.   Naproxen Sodium 220 MG Caps Commonly known as:  CVS NAPROXEN SODIUM Take 2 capsules (440 mg total) by mouth 2 (two) times daily.   rosuvastatin 10 MG tablet Commonly known as:  CRESTOR TAKE 1 TABLET BY MOUTH MONDAY THROUGH FRIDAY   venlafaxine XR 75 MG 24 hr capsule Commonly known as:  EFFEXOR-XR Take 1 capsule (75 mg total) by mouth daily.     VITAMIN D-3 PO Take 2,000 Int'l Units by mouth daily.       All past medical history, surgical history, allergies, family history, immunizations andmedications were updated in the EMR today and reviewed under the history and medication portions of their EMR.     ROS: Negative, with the exception of above mentioned in HPI   Objective:  BP 132/82   Pulse 98   Temp 98.5 F (36.9 C) (Oral)   Resp 16   Ht 5' 4"  (1.626 m)   Wt 261 lb (118.4 kg)   SpO2 95%   BMI 44.80 kg/m  Body mass index is 44.8 kg/m. Gen: Afebrile. No acute distress.  Toxic and presentation, well-developed, well-nourished, Caucasian female.  Obese. HENT: AT. [place]. Bilateral TM visualized and normal in appearance. MMM. Bilateral nares erythema, swelling or drainage. Throat without erythema or exudates.  No cough. Eyes:Pupils Equal Round Reactive to light, Extraocular movements intact,  Conjunctiva without redness, discharge or icterus. Neck/lymp/endocrine: Supple, no lymphadenopathy, no thyromegaly CV: RRR no murmur, no edema, +2/4 P posterior tibialis pulses Chest: CTAB, no wheeze or crackles Abd: Soft.  obese. NTND. BS present.  No masses palpated.  MSK: Recall to climbing up and down on table secondary to left knee arthritis.  Otherwise normal range of motion and neurovascularly intact. Skin: No rashes, purpura or petechiae.  Neuro: Unchanged gait. PERLA. EOMi. Alert. Oriented x3 Psych: Mildly stressed today.  Normal affect, dress and demeanor. Normal speech. Normal thought content and judgment.    No exam data present No results found. No results found for this or any previous visit (from the past 24 hour(s)).  Assessment/Plan: Brandy Bowman is a 68 y.o. female present for OV for  Essential hypertension, benign/HLD/morbid obesity - Continue irbesartan to 225 mg (1.5 tabs ). If still borderline next visit may increase to 300  Mg QD. - Watch added salt and exercise > 150 minutes a week  - Continue  crestor 10 mg QD, refills provided today. - microalbuminuria present 06/2014, pt is on a -sartan.  - No ASA use --> takes daily naproxen - CBC, CMP, A1c, lipid panel collected today - F/U 6 months.  Unless needed sooner Depressive disorder - She seemed to have increased anxiety today discussed increasing her Effexor to give her better coverage and she declined.  She was worried about weight gain.  Discussed that Effexor is weight neutral or her accident, not a weight gain side effect.  Still declined increase today and states she will be all right. -  continue effexor 75 mg.  Refills provided today. - f/u q 6 months.  Elevated hemoglobin A1c - HgB A1c Encounter for preventative adult health care examination Patient was encouraged to exercise greater than 150 minutes a week. Patient was encouraged to choose a diet filled with fresh fruits and vegetables, and lean meats. AVS provided to patient today for education/recommendation on gender specific health and safety maintenance. Colonoscopy:  04/09/2011- [facility].  Mammogram: Patient declined mammogram. Last 2016- pt reports "they are always ok." Immunizations: Tetanus UTD 04/2011, influenza UTD 01/2018, pneumonia series completed.  Shingrix - pt has tried to get at a pharmacy but they have been out- she has not checked lately- she will try again.  Infectious disease screening: Hep C completed Mammogram declined    Reviewed expectations re: course of current medical issues.  Discussed self-management of symptoms.  Outlined signs and symptoms indicating need for more acute intervention.  Patient verbalized understanding and all questions were answered.  Patient received an After-Visit Summary.    Orders Placed This Encounter  Procedures  . HgB A1c  . Comp Met (CMET)  . Lipid panel  . CBC    CPE/preventative physical completed today with provider, in addition to additional 15 minutes of which 50% of that time was spent face-to-face  discussing patient's chronic medical conditions of hypertension, medications, depression/anxiety.  Note is dictated utilizing voice recognition software. Although note has been proof read prior to signing, occasional typographical errors still can be missed. If any questions arise, please do not hesitate to call for verification.   electronically signed by:  Howard Pouch, DO  [facility]

## 2018-04-16 NOTE — Patient Instructions (Signed)
Health Maintenance After Age 69 After age 69, you are at a higher risk for certain long-term diseases and infections as well as injuries from falls. Falls are a major cause of broken bones and head injuries in people who are older than age 69. Getting regular preventive care can help to keep you healthy and well. Preventive care includes getting regular testing and making lifestyle changes as recommended by your health care provider. Talk with your health care provider about:  Which screenings and tests you should have. A screening is a test that checks for a disease when you have no symptoms.  A diet and exercise plan that is right for you. What should I know about screenings and tests to prevent falls? Screening and testing are the best ways to find a health problem early. Early diagnosis and treatment give you the best chance of managing medical conditions that are common after age 69. Certain conditions and lifestyle choices may make you more likely to have a fall. Your health care provider may recommend:  Regular vision checks. Poor vision and conditions such as cataracts can make you more likely to have a fall. If you wear glasses, make sure to get your prescription updated if your vision changes.  Medicine review. Work with your health care provider to regularly review all of the medicines you are taking, including over-the-counter medicines. Ask your health care provider about any side effects that may make you more likely to have a fall. Tell your health care provider if any medicines that you take make you feel dizzy or sleepy.  Osteoporosis screening. Osteoporosis is a condition that causes the bones to get weaker. This can make the bones weak and cause them to break more easily.  Blood pressure screening. Blood pressure changes and medicines to control blood pressure can make you feel dizzy.  Strength and balance checks. Your health care provider may recommend certain tests to check your  strength and balance while standing, walking, or changing positions.  Foot health exam. Foot pain and numbness, as well as not wearing proper footwear, can make you more likely to have a fall.  Depression screening. You may be more likely to have a fall if you have a fear of falling, feel emotionally low, or feel unable to do activities that you used to do.  Alcohol use screening. Using too much alcohol can affect your balance and may make you more likely to have a fall. What actions can I take to lower my risk of falls? General instructions  Talk with your health care provider about your risks for falling. Tell your health care provider if: ? You fall. Be sure to tell your health care provider about all falls, even ones that seem minor. ? You feel dizzy, sleepy, or off-balance.  Take over-the-counter and prescription medicines only as told by your health care provider. These include any supplements.  Eat a healthy diet and maintain a healthy weight. A healthy diet includes low-fat dairy products, low-fat (lean) meats, and fiber from whole grains, beans, and lots of fruits and vegetables. Home safety  Remove any tripping hazards, such as rugs, cords, and clutter.  Install safety equipment such as grab bars in bathrooms and safety rails on stairs.  Keep rooms and walkways well-lit. Activity   Follow a regular exercise program to stay fit. This will help you maintain your balance. Ask your health care provider what types of exercise are appropriate for you.  If you need a cane or   walker, use it as recommended by your health care provider.  Wear supportive shoes that have nonskid soles. Lifestyle  Do not drink alcohol if your health care provider tells you not to drink.  If you drink alcohol, limit how much you have: ? 0-1 drink a day for women. ? 0-2 drinks a day for men.  Be aware of how much alcohol is in your drink. In the [place] one drink equals one typical bottle of beer (12  oz), one-half glass of wine (5 oz), or one shot of hard liquor (1 oz).  Do not use any products that contain nicotine or tobacco, such as cigarettes and e-cigarettes. If you need help quitting, ask your health care provider. Summary  Having a healthy lifestyle and getting preventive care can help to protect your health and wellness after age 69.  Screening and testing are the best way to find a health problem early and help you avoid having a fall. Early diagnosis and treatment give you the best chance for managing medical conditions that are more common for people who are older than age 69.  Falls are a major cause of broken bones and head injuries in people who are older than age 69. Take precautions to prevent a fall at home.  Work with your health care provider to learn what changes you can make to improve your health and wellness and to prevent falls. This information is not intended to replace advice given to you by your health care provider. Make sure you discuss any questions you have with your health care provider. Document Released: 02/05/2017 Document Revised: 02/05/2017 Document Reviewed: 02/05/2017 Elsevier Interactive Patient Education  2019 [employer]  

## 2018-09-29 ENCOUNTER — Telehealth: Payer: Self-pay

## 2018-09-29 DIAGNOSIS — I10 Essential (primary) hypertension: Secondary | ICD-10-CM

## 2018-09-29 MED ORDER — IRBESARTAN 150 MG PO TABS: 225.0000 mg | ORAL_TABLET | Freq: Every day | ORAL | 0 refills | Status: DC

## 2018-09-29 NOTE — Telephone Encounter (Signed)
Faxed refill request sent by pharmacy for pts Avapro. Called and spoke with pharmacy and they have no refills left for patient and pt called and needs med refilled. Refill was last picked up by pt in March. Refill sent to pharmacy. Pt has upcoming appt scheduled.

## 2018-10-15 ENCOUNTER — Encounter: Payer: Self-pay | Admitting: Family Medicine

## 2018-10-15 ENCOUNTER — Other Ambulatory Visit: Payer: Self-pay

## 2018-10-15 ENCOUNTER — Ambulatory Visit (INDEPENDENT_AMBULATORY_CARE_PROVIDER_SITE_OTHER): Payer: Medicare Other | Admitting: Family Medicine

## 2018-10-15 VITALS — BP 139/80 | HR 81 | Temp 98.0°F | Resp 16 | Ht 64.0 in | Wt 259.4 lb

## 2018-10-15 DIAGNOSIS — E7849 Other hyperlipidemia: Secondary | ICD-10-CM

## 2018-10-15 DIAGNOSIS — E559 Vitamin D deficiency, unspecified: Secondary | ICD-10-CM

## 2018-10-15 DIAGNOSIS — I10 Essential (primary) hypertension: Secondary | ICD-10-CM | POA: Diagnosis not present

## 2018-10-15 DIAGNOSIS — F32A Depression, unspecified: Secondary | ICD-10-CM

## 2018-10-15 DIAGNOSIS — F329 Major depressive disorder, single episode, unspecified: Secondary | ICD-10-CM | POA: Diagnosis not present

## 2018-10-15 DIAGNOSIS — E782 Mixed hyperlipidemia: Secondary | ICD-10-CM | POA: Diagnosis not present

## 2018-10-15 DIAGNOSIS — R7309 Other abnormal glucose: Secondary | ICD-10-CM

## 2018-10-15 LAB — HEMOGLOBIN A1C: Hgb A1c MFr Bld: 5.9 % (ref 4.6–6.5)

## 2018-10-15 LAB — TSH: TSH: 2.6 u[IU]/mL (ref 0.35–4.50)

## 2018-10-15 LAB — VITAMIN D 25 HYDROXY (VIT D DEFICIENCY, FRACTURES): VITD: 30.4 ng/mL (ref 30.00–100.00)

## 2018-10-15 MED ORDER — VENLAFAXINE HCL ER 37.5 MG PO CP24: 37.5000 mg | ORAL_CAPSULE | Freq: Every day | ORAL | 0 refills | Status: DC

## 2018-10-15 MED ORDER — IRBESARTAN 150 MG PO TABS: 225.0000 mg | ORAL_TABLET | Freq: Every day | ORAL | 1 refills | Status: DC

## 2018-10-15 MED ORDER — ROSUVASTATIN CALCIUM 10 MG PO TABS: ORAL_TABLET | ORAL | 6 refills | Status: DC

## 2018-10-15 NOTE — Progress Notes (Signed)
Brandy Bowman , Brandy Bowman 09, 1952, 68 y.o., female MRN: [id number] Patient Care Team    Relationship Specialty Notifications Start End  Ma Hillock, DO PCP - General Family Medicine  03/08/16     Chief Complaint  Patient presents with  . Follow-up    CMC, pt is fasting     Subjective:   Essential hypertension, benign//hyperlipidemia/morbid obesity:   Pt reports compliance with Crestor 10 mg, fish oil supplement and irbesartan 225 mg. Blood pressures ranges at home are WNL. Patient denies chest pain, shortness of breath, dizziness or lower extremity edema.   Pt is not on  ASA 2/2 daily naproxen use. Pt is prescribed statin. BMP: 10/13/2017 within normal limits CBC: 10/13/2017 within normal limits Lipid panel: 04/15/2017 total cholesterol 167, LDL 88, HDL 47, triglycerides 155 TSH: 10/13/2017 2.86 Microalbuminuria: 2.7 06/24/2014--> now on a sartan Diet: Low sodium,  Very little meat consumption  Exercise: Not exercising routinely.  RF: HTN, HLD, No Fhx    Depressive disorder:  Pt is doing ok on effexor 75 mg QD.She denies any side effects on medication.  She is under more stress lately.  Her stress surrounds the assistance and care for her husband. She has been staying home during coronavirus pandemic.   Depression screen [facility] 2/9 04/16/2018 04/10/2017 03/11/2017 10/08/2016 03/08/2016  Decreased Interest 1 0 1 0 0  Down, Depressed, Hopeless 1 1 1 3  0  PHQ - 2 Score 2 1 2 3  0  Altered sleeping 0 0 0 0 -  Tired, decreased energy 2 1 2 1  -  Change in appetite 0 0 0 0 -  Feeling bad or failure about yourself  0 0 1 0 -  Trouble concentrating 0 0 0 0 -  Moving slowly or fidgety/restless 0 0 0 0 -  Suicidal thoughts 0 0 0 0 -  PHQ-9 Score 4 2 5 4  -  Difficult doing work/chores Not difficult at all - Not difficult at all - -   GAD 7 : Generalized Anxiety Score 04/16/2018  Nervous, Anxious, on Edge 1  Control/stop worrying 1  Worry too much - different things 1  Trouble relaxing 0  Restless 0   Easily annoyed or irritable 2  Afraid - awful might happen 0  Total GAD 7 Score 5  Anxiety Difficulty Not difficult at all   No Known Allergies Social History   Tobacco Use  . Smoking status: Never Smoker  . Smokeless tobacco: Never Used  Substance Use Topics  . Alcohol use: No   Past Medical History:  Diagnosis Date  . Allergy   . Anxiety   . Arthritis   . Depression   . Hyperlipidemia   . Hypertension   . IFG (impaired fasting glucose)    A1c's all 5.9% or less  . Vitamin D deficiency    Past Surgical History:  Procedure Laterality Date  . TUBAL LIGATION     Family History  Problem Relation Age of Onset  . Dementia Mother   . Pneumonia Father   . Rheum arthritis Paternal Uncle    Allergies as of 10/15/2018   No Known Allergies     Medication List       Accurate as of October 15, 2018  9:56 AM. If you have any questions, ask your nurse or doctor.        STOP taking these medications   cetirizine 10 MG tablet Commonly known as: ZYRTEC Stopped by: Howard Pouch, DO   Naproxen Sodium 220  MG Caps Commonly known as: [facility] Naproxen Sodium Stopped by: Felix Pacinienee Naeem Quillin, DO     TAKE these medications   CENTRUM ULTRA WOMENS PO Take by mouth daily.   FISH OIL PO Take by mouth.   irbesartan 150 MG tablet Commonly known as: AVAPRO Take 1.5 tablets (225 mg total) by mouth at bedtime.   Nasacort Allergy 24HR 55 MCG/ACT Aero nasal inhaler Generic drug: triamcinolone Place 2 sprays into the nose as needed.   rosuvastatin 10 MG tablet Commonly known as: CRESTOR TAKE 1 TABLET BY MOUTH MONDAY THROUGH FRIDAY   venlafaxine XR 37.5 MG 24 hr capsule Commonly known as: EFFEXOR-XR Take 1 capsule (37.5 mg total) by mouth daily. What changed:   medication strength  how much to take Changed by: Felix Pacinienee Kailon Treese, DO   VITAMIN D-3 PO Take 2,000 Int'l Units by mouth daily.       All past medical history, surgical history, allergies, family history, immunizations  andmedications were updated in the EMR today and reviewed under the history and medication portions of their EMR.     ROS: Negative, with the exception of above mentioned in HPI   Objective:  BP 139/80 (BP Location: Right Arm, Patient Position: Sitting, Cuff Size: Large)   Pulse 81   Temp 98 F (36.7 C) (Temporal)   Resp 16   Ht 5\' 4"  (1.626 m)   Wt 259 lb 6.4 oz (117.7 kg)   SpO2 93%   BMI 44.53 kg/m  Body mass index is 44.53 kg/m. Gen: Afebrile. No acute distress.  HENT: AT. [place]. MMM.  Eyes:Pupils Equal Round Reactive to light, Extraocular movements intact,  Conjunctiva without redness, discharge or icterus. Neck/lymp/endocrine: Supple,no lymphadenopathy, no thyromegaly CV: RRR no murmur, no edema, +2/4 P posterior tibialis pulses Chest: CTAB, no wheeze or crackles Abd: Soft. NTND. BS present. no Masses palpated.  Skin: no rashes, purpura or petechiae.  Neuro:  Normal gait. PERLA. EOMi. Alert. Oriented x3  Psych: Normal affect, dress and demeanor. Normal speech. Normal thought content and judgment.  No exam data present No results found. No results found for this or any previous visit (from the past 24 hour(s)).  Assessment/Plan: Brandy Bowman is a 68 y.o. female present for OV for  Essential hypertension, benign/HLD/morbid obesity/eleavted a1c - Monitor BP at home- goal < 130 systolic for her. For now continue  irbesartan to 225 mg (1.5 tabs )>> refilled provided.   - Watch added salt and exercise > 150 minutes a week  - BMP yearly: UTD 10/2017 - Lipid UTD 2019, controlled on low dose crestor. Continue crestor 10 mg QD, refills provided today. - microalbuminuria present 06/2014, pt is on a -sartan.  - No ASA use --> takes daily naproxen - tsh and a1c collected today; last a1c 6.0 - F/U 6 months.  Unless needed sooner  Depressive disorder - stable Doing well on current dos, but would like to try to decrease dose.  - prescribed effexor 37.5 mg trial per prequest- pt  will call in 2-3 mos and let us know dose same, taper off or increase back to 75  - f/u q 6 months.   Vit d def: - supplementing 2000u daily. Last level check 2018.     Reviewed expectations re: course of current medical issues.  Discussed self-management of symptoms.  Outlined signs and symptoms indicating need for more acute intervention.  Patient verbalized understanding and all questions were answered.  Patient received an After-Visit Summary.    Orders Placed This Encounter  Procedures  . TSH  . Hemoglobin A1c  . Vitamin D (25 hydroxy)     Note is dictated utilizing voice recognition software. Although note has been proof read prior to signing, occasional typographical errors still can be missed. If any questions arise, please do not hesitate to call for verification.   electronically signed by:  Felix Pacinienee Jiovany Scheffel, DO  [facility] - [place]

## 2018-10-15 NOTE — Patient Instructions (Signed)
Recheck BP at home and make sure it is < 135/ 85 (120 best) on current dose- if not please call in and we will adjust med and then see back to recheck after adjustment.    Decrease effexor to 37.5 mg. Call in 2-3 mos and let us know if you want to taper off, keep the same or increase back 75 mg a day.   Great to see you today. Stay safe!

## 2018-12-16 ENCOUNTER — Other Ambulatory Visit: Payer: Self-pay

## 2018-12-16 ENCOUNTER — Ambulatory Visit (INDEPENDENT_AMBULATORY_CARE_PROVIDER_SITE_OTHER): Payer: Medicare Other

## 2018-12-16 DIAGNOSIS — Z23 Encounter for immunization: Secondary | ICD-10-CM

## 2019-01-11 ENCOUNTER — Other Ambulatory Visit: Payer: Self-pay

## 2019-01-11 DIAGNOSIS — F32A Depression, unspecified: Secondary | ICD-10-CM

## 2019-01-11 DIAGNOSIS — F329 Major depressive disorder, single episode, unspecified: Secondary | ICD-10-CM

## 2019-01-11 MED ORDER — VENLAFAXINE HCL ER 37.5 MG PO CP24: 37.5000 mg | ORAL_CAPSULE | Freq: Every day | ORAL | 0 refills | Status: DC

## 2019-04-13 ENCOUNTER — Other Ambulatory Visit: Payer: Self-pay

## 2019-04-13 ENCOUNTER — Encounter: Payer: Self-pay | Admitting: Family Medicine

## 2019-04-13 ENCOUNTER — Telehealth: Payer: Self-pay

## 2019-04-13 ENCOUNTER — Ambulatory Visit (INDEPENDENT_AMBULATORY_CARE_PROVIDER_SITE_OTHER): Payer: Medicare Other | Admitting: Family Medicine

## 2019-04-13 DIAGNOSIS — F329 Major depressive disorder, single episode, unspecified: Secondary | ICD-10-CM

## 2019-04-13 DIAGNOSIS — E7849 Other hyperlipidemia: Secondary | ICD-10-CM

## 2019-04-13 DIAGNOSIS — R7303 Prediabetes: Secondary | ICD-10-CM

## 2019-04-13 DIAGNOSIS — F32A Depression, unspecified: Secondary | ICD-10-CM

## 2019-04-13 DIAGNOSIS — I10 Essential (primary) hypertension: Secondary | ICD-10-CM | POA: Diagnosis not present

## 2019-04-13 MED ORDER — VENLAFAXINE HCL ER 37.5 MG PO CP24: 37.5000 mg | ORAL_CAPSULE | Freq: Every day | ORAL | 1 refills | Status: DC

## 2019-04-13 MED ORDER — ROSUVASTATIN CALCIUM 10 MG PO TABS: ORAL_TABLET | ORAL | 6 refills | Status: DC

## 2019-04-13 MED ORDER — IRBESARTAN 150 MG PO TABS: 225.0000 mg | ORAL_TABLET | Freq: Every day | ORAL | 1 refills | Status: DC

## 2019-04-13 NOTE — Telephone Encounter (Signed)
Patient called to let ask that the Effexor to be sent to her pharmacy. She only has 2 pills left.

## 2019-04-13 NOTE — Progress Notes (Signed)
VIRTUAL VISIT VIA VIDEO  I connected with Brandy Bowman on 04/13/19 at 10:00 AM EST by a video enabled telemedicine application and verified that I am speaking with the correct person using two identifiers. Location patient: Home Location provider: [facility], Office Persons participating in the virtual visit: Patient, Dr. Raoul Pitch and R.Baker, LPN  I discussed the limitations of evaluation and management by telemedicine and the availability of in person appointments. The patient expressed understanding and agreed to proceed.     Brandy Bowman , 23-Apr-1950, 69 y.o., female MRN: [id number] Patient Care Team    Relationship Specialty Notifications Start End  Ma Hillock, DO PCP - General Family Medicine  03/08/16     Chief Complaint  Patient presents with  . Hypertension  . Hyperlipidemia  . Depression    Pt needs refills on medications      Subjective: Brandy Bowman is a 69 y.o. present for [facility] followup  Essential hypertension, benign//hyperlipidemia/morbid obesity:   Pt reports compliance with Crestor 10 mg, fish oil supplement and irbesartan 225 mg. Blood pressures ranges at home are WNL. Patient denies chest pain, shortness of breath, dizziness or lower extremity edema.   Pt is not on  ASA 2/2 daily naproxen use. Pt is prescribed statin. Labs  CBC: 10/13/2017 within normal limits Lipid panel: 04/15/2017 total cholesterol 167, LDL 88, HDL 47, triglycerides 155 TSH: 10/2018 within normal limits Microalbuminuria: 2.7 06/24/2014--> now on a sartan Diet: Low sodium,  Very little meat consumption  Exercise: Not exercising routinely.  RF: HTN, HLD, No Fhx    Depressive disorder:  Patient reports she is doing well on current dose of Effexor.  She does need refills today.  Her stress surrounds the assistance and care for her husband. She has been staying home during coronavirus pandemic.   Depression screen [facility] 2/9 04/13/2019 04/16/2018 04/10/2017 03/11/2017 10/08/2016    Decreased Interest 0 1 0 1 0  Down, Depressed, Hopeless 0 1 1 1 3   PHQ - 2 Score 0 2 1 2 3   Altered sleeping 0 0 0 0 0  Tired, decreased energy 1 2 1 2 1   Change in appetite 0 0 0 0 0  Feeling bad or failure about yourself  0 0 0 1 0  Trouble concentrating 0 0 0 0 0  Moving slowly or fidgety/restless 0 0 0 0 0  Suicidal thoughts 0 0 0 0 0  PHQ-9 Score 1 4 2 5 4   Difficult doing work/chores Not difficult at all Not difficult at all - Not difficult at all -   GAD 7 : Generalized Anxiety Score 04/16/2018  Nervous, Anxious, on Edge 1  Control/stop worrying 1  Worry too much - different things 1  Trouble relaxing 0  Restless 0  Easily annoyed or irritable 2  Afraid - awful might happen 0  Total GAD 7 Score 5  Anxiety Difficulty Not difficult at all   No Known Allergies Social History   Tobacco Use  . Smoking status: Never Smoker  . Smokeless tobacco: Never Used  Substance Use Topics  . Alcohol use: No   Past Medical History:  Diagnosis Date  . Allergy   . Anxiety   . Arthritis   . Depression   . Hyperlipidemia   . Hypertension   . IFG (impaired fasting glucose)    A1c's all 5.9% or less  . Vitamin D deficiency    Past Surgical History:  Procedure Laterality Date  . TUBAL LIGATION  Family History  Problem Relation Age of Onset  . Dementia Mother   . Pneumonia Father   . Rheum arthritis Paternal Uncle    Allergies as of 04/13/2019   No Known Allergies     Medication List       Accurate as of April 13, 2019 10:18 AM. If you have any questions, ask your nurse or doctor.        CENTRUM ULTRA WOMENS PO Take by mouth daily.   FISH OIL PO Take by mouth.   irbesartan 150 MG tablet Commonly known as: AVAPRO Take 1.5 tablets (225 mg total) by mouth at bedtime.   Nasacort Allergy 24HR 55 MCG/ACT Aero nasal inhaler Generic drug: triamcinolone Place 2 sprays into the nose as needed.   polycarbophil 625 MG tablet Commonly known as: FIBERCON Take 625 mg  by mouth daily.   rosuvastatin 10 MG tablet Commonly known as: CRESTOR TAKE 1 TABLET BY MOUTH MONDAY THROUGH FRIDAY   venlafaxine XR 37.5 MG 24 hr capsule Commonly known as: EFFEXOR-XR Take 1 capsule (37.5 mg total) by mouth daily.   VITAMIN D-3 PO Take 2,000 Int'l Units by mouth daily.       All past medical history, surgical history, allergies, family history, immunizations andmedications were updated in the EMR today and reviewed under the history and medication portions of their EMR.     ROS: Negative, with the exception of above mentioned in HPI   Objective:  BP 122/73   Temp 98.6 F (37 C) (Oral)   Ht 5' 4"  (1.626 m)   Wt 255 lb (115.7 kg)   BMI 43.77 kg/m  Body mass index is 43.77 kg/m. Gen: Afebrile. No acute distress.  Nontoxic in appearance.  Looks well. HENT: AT. [place].  Eyes:Pupils Equal Round Reactive to light, Extraocular movements intact,  Conjunctiva without redness, discharge or icterus. CV: No edema Chest: No cough or shortness of breath Neuro:  Alert. Oriented.  Psych: Normal affect, dress and demeanor. Normal speech. Normal thought content and judgment.   No exam data present No results found. No results found for this or any previous visit (from the past 24 hour(s)).  Assessment/Plan: [name] is a 69 y.o. female present for OV for  Essential hypertension, benign/HLD/morbid obesity/eleavted a1c - Stable.  Continue/refilled irbesartan to 225 mg (1.5 tabs ) - Watch added salt and exercise > 150 minutes a week  - Continue crestor 10 mg QD, refills provided today. - microalbuminuria present 06/2014, pt is on a -sartan.  - No ASA use --> takes daily naproxen -Lab appointment scheduled for CBC, CMP, lipid panel and A1c fasting. - F/U 6 months.  Unless needed sooner  Depressive disorder Stable.  Patient doing well on current dose. Continue/refills provided on Effexor 37.5 mg daily. - f/u q 6 months.   Vit d def: - supplementing 3000u  daily. Last level check 10/15/2018 increased to 3000 units at that time secondary to borderline level of 30    Reviewed expectations re: course of current medical issues.  Discussed self-management of symptoms.  Outlined signs and symptoms indicating need for more acute intervention.  Patient verbalized understanding and all questions were answered.  Patient received an After-Visit Summary.    Orders Placed This Encounter  Procedures  . CBC  . Comp Met (CMET)  . Hemoglobin A1c  . Lipid panel   Meds ordered this encounter  Medications  . rosuvastatin (CRESTOR) 10 MG tablet    Sig: TAKE 1 TABLET BY MOUTH MONDAY  THROUGH FRIDAY    Dispense:  24 tablet    Refill:  6  . venlafaxine XR (EFFEXOR-XR) 37.5 MG 24 hr capsule    Sig: Take 1 capsule (37.5 mg total) by mouth daily.    Dispense:  90 capsule    Refill:  1  . irbesartan (AVAPRO) 150 MG tablet    Sig: Take 1.5 tablets (225 mg total) by mouth at bedtime.    Dispense:  135 tablet    Refill:  1     Note is dictated utilizing voice recognition software. Although note has been proof read prior to signing, occasional typographical errors still can be missed. If any questions arise, please do not hesitate to call for verification.   electronically signed by:  Howard Pouch, DO  [place]

## 2019-04-13 NOTE — Telephone Encounter (Signed)
Medication has been sent to pharmacy at appt by Dr Claiborne Billings. Pt was called and given information.

## 2019-04-15 ENCOUNTER — Ambulatory Visit (INDEPENDENT_AMBULATORY_CARE_PROVIDER_SITE_OTHER): Payer: Medicare Other | Admitting: Family Medicine

## 2019-04-15 ENCOUNTER — Telehealth: Payer: Self-pay

## 2019-04-15 ENCOUNTER — Other Ambulatory Visit: Payer: Self-pay

## 2019-04-15 DIAGNOSIS — I10 Essential (primary) hypertension: Secondary | ICD-10-CM

## 2019-04-15 DIAGNOSIS — R7303 Prediabetes: Secondary | ICD-10-CM

## 2019-04-15 DIAGNOSIS — E7849 Other hyperlipidemia: Secondary | ICD-10-CM | POA: Diagnosis not present

## 2019-04-15 LAB — LIPID PANEL
Cholesterol: 164 mg/dL (ref 0–200)
HDL: 47.6 mg/dL (ref >39.00)
LDL Cholesterol: 83 mg/dL (ref 0–99)
NonHDL: 116.42
Total CHOL/HDL Ratio: 3
Triglycerides: 167 mg/dL — ABNORMAL HIGH (ref 0.0–149.0)
VLDL: 33.4 mg/dL (ref 0.0–40.0)

## 2019-04-15 LAB — COMPREHENSIVE METABOLIC PANEL
ALT: 20 U/L (ref 0–35)
AST: 19 U/L (ref 0–37)
Albumin: 4.2 g/dL (ref 3.5–5.2)
Alkaline Phosphatase: 75 U/L (ref 39–117)
BUN: 14 mg/dL (ref 6–23)
CO2: 31 mEq/L (ref 19–32)
Calcium: 9.2 mg/dL (ref 8.4–10.5)
Chloride: 104 mEq/L (ref 96–112)
Creatinine, Ser: 0.62 mg/dL (ref 0.40–1.20)
GFR: 95.42 mL/min (ref >60.00)
Glucose, Bld: 108 mg/dL — ABNORMAL HIGH (ref 70–99)
Potassium: 4.3 mEq/L (ref 3.5–5.1)
Sodium: 141 mEq/L (ref 135–145)
Total Bilirubin: 0.6 mg/dL (ref 0.2–1.2)
Total Protein: 6.8 g/dL (ref 6.0–8.3)

## 2019-04-15 LAB — HEMOGLOBIN A1C: Hgb A1c MFr Bld: 5.8 % (ref 4.6–6.5)

## 2019-04-15 LAB — CBC
HCT: 41.3 % (ref 36.0–46.0)
Hemoglobin: 13.6 g/dL (ref 12.0–15.0)
MCHC: 33 g/dL (ref 30.0–36.0)
MCV: 91.3 fl (ref 78.0–100.0)
Platelets: 275 10*3/uL (ref 150.0–400.0)
RBC: 4.52 Mil/uL (ref 3.87–5.11)
RDW: 13.7 % (ref 11.5–15.5)
WBC: 8.6 10*3/uL (ref 4.0–10.5)

## 2019-04-15 NOTE — Telephone Encounter (Signed)
Pt dropped off handicap placard renewal paperwork to be filled out and signed. Placed on Dr Blenda Bridegroom desk to review. Pt last appt was 04/13/2019 for [facility].

## 2019-04-15 NOTE — Telephone Encounter (Signed)
Paperwork was signed and placed on my desk. Pt was called and paperwork placed up front to be picked up.

## 2019-05-06 ENCOUNTER — Ambulatory Visit: Payer: Medicare Other

## 2019-05-11 ENCOUNTER — Ambulatory Visit: Payer: Medicare Other

## 2019-05-15 ENCOUNTER — Ambulatory Visit: Payer: Medicare Other | Attending: Internal Medicine

## 2019-05-15 DIAGNOSIS — Z23 Encounter for immunization: Secondary | ICD-10-CM | POA: Insufficient documentation

## 2019-05-15 NOTE — Progress Notes (Signed)
   Covid-19 Vaccination Clinic  Name:  Pina Sirianni    MRN: [id number] DOB: 02-10-1951  05/15/2019  Ms. Roa-Remache was observed post Covid-19 immunization for 15 minutes without incidence. She was provided with Vaccine Information Sheet and instruction to access the V-Safe system.   Ms. Matsuura was instructed to call 911 with any severe reactions post vaccine: Marland Kitchen Difficulty breathing  . Swelling of your face and throat  . A fast heartbeat  . A bad rash all over your body  . Dizziness and weakness    Immunizations Administered    Name Date Dose VIS Date Route   Pfizer COVID-19 Vaccine 05/15/2019 10:49 AM 0.3 mL 03/19/2019 Intramuscular   Manufacturer: [employer], [employer]   Lot: [id number]   NDC: [address]-9

## 2019-06-09 ENCOUNTER — Ambulatory Visit: Payer: Medicare Other | Attending: Internal Medicine

## 2019-06-09 DIAGNOSIS — Z23 Encounter for immunization: Secondary | ICD-10-CM | POA: Insufficient documentation

## 2019-06-09 NOTE — Progress Notes (Signed)
   Covid-19 Vaccination Clinic  Name:  Brandy Bowman    MRN: [id number] DOB: Apr 20, 1950  06/09/2019  Brandy Bowman was observed post Covid-19 immunization for 15 minutes without incident. She was provided with Vaccine Information Sheet and instruction to access the V-Safe system.   Brandy Bowman was instructed to call 911 with any severe reactions post vaccine: Marland Kitchen Difficulty breathing  . Swelling of face and throat  . A fast heartbeat  . A bad rash all over body  . Dizziness and weakness   Immunizations Administered    Name Date Dose VIS Date Route   Pfizer COVID-19 Vaccine 06/09/2019 10:31 AM 0.3 mL 03/19/2019 Intramuscular   Manufacturer: [employer], [employer]   Lot: [id number]   NDC: [address]-8

## 2019-10-04 ENCOUNTER — Other Ambulatory Visit: Payer: Self-pay | Admitting: Family Medicine

## 2019-10-04 DIAGNOSIS — F32A Depression, unspecified: Secondary | ICD-10-CM

## 2019-10-07 ENCOUNTER — Telehealth: Payer: Self-pay

## 2019-10-07 DIAGNOSIS — F32A Depression, unspecified: Secondary | ICD-10-CM

## 2019-10-07 MED ORDER — VENLAFAXINE HCL ER 37.5 MG PO CP24: 37.5000 mg | ORAL_CAPSULE | Freq: Every day | ORAL | 0 refills | Status: DC

## 2019-10-07 NOTE — Telephone Encounter (Signed)
Patient had to schedule her appt for next week due to provider being out of office. Patient will be out of medication before her appt. Wanting to see if she can get a bridge of medication until 10/20/19 appt  .  venlafaxine XR (EFFEXOR-XR) 37.5 MG 24 hr capsule   [id number] - [place],  - [address]

## 2019-10-07 NOTE — Telephone Encounter (Signed)
2 week supply was sent to pharmacy, pt was called and given information

## 2019-10-14 ENCOUNTER — Other Ambulatory Visit: Payer: Self-pay | Admitting: Family Medicine

## 2019-10-14 ENCOUNTER — Ambulatory Visit: Payer: Medicare Other | Admitting: Family Medicine

## 2019-10-14 DIAGNOSIS — F32A Depression, unspecified: Secondary | ICD-10-CM

## 2019-10-14 NOTE — Telephone Encounter (Signed)
Patient has enough RX to last until her appt 10/20/19.

## 2019-10-19 MED ORDER — VENLAFAXINE HCL ER 37.5 MG PO CP24: 37.5000 mg | ORAL_CAPSULE | Freq: Every day | ORAL | 0 refills | Status: DC

## 2019-10-19 NOTE — Telephone Encounter (Signed)
30 day supply sent to pharmacy to last pt until appt at the end of the month.

## 2019-10-19 NOTE — Addendum Note (Signed)
Addended by: Daryel November L on: 10/19/2019 11:37 AM   Modules accepted: Orders

## 2019-10-19 NOTE — Telephone Encounter (Signed)
Patient had an appt for tomorrow but Dr. Claiborne Billings not in office again for patient to get refill on her medication.  Patient is very upset. I calmed her down. I assured her everything is okay and Dr. Claiborne Billings is still her PCP.  Patient will be out of medication. Dr. Alan Ripper first available appt (unless we work patient in prior to) is 11/01/19 at 2pm.   venlafaxine XR (EFFEXOR-XR) 37.5 MG 24 hr capsule  [id number] - [place], [place] - [address]  Patient can be reached at [phone]

## 2019-10-19 NOTE — Telephone Encounter (Signed)
Left detailed message advising patient that refill sent until appt.

## 2019-10-20 ENCOUNTER — Ambulatory Visit: Payer: Medicare Other | Admitting: Family Medicine

## 2019-11-01 ENCOUNTER — Ambulatory Visit (INDEPENDENT_AMBULATORY_CARE_PROVIDER_SITE_OTHER): Payer: Medicare Other | Admitting: Family Medicine

## 2019-11-01 ENCOUNTER — Other Ambulatory Visit: Payer: Self-pay

## 2019-11-01 ENCOUNTER — Encounter: Payer: Self-pay | Admitting: Family Medicine

## 2019-11-01 VITALS — BP 131/82 | HR 80 | Temp 98.2°F | Resp 18 | Ht 64.0 in | Wt 249.4 lb

## 2019-11-01 DIAGNOSIS — F32A Depression, unspecified: Secondary | ICD-10-CM

## 2019-11-01 DIAGNOSIS — I10 Essential (primary) hypertension: Secondary | ICD-10-CM

## 2019-11-01 DIAGNOSIS — F329 Major depressive disorder, single episode, unspecified: Secondary | ICD-10-CM

## 2019-11-01 DIAGNOSIS — E782 Mixed hyperlipidemia: Secondary | ICD-10-CM

## 2019-11-01 DIAGNOSIS — E559 Vitamin D deficiency, unspecified: Secondary | ICD-10-CM

## 2019-11-01 DIAGNOSIS — E7849 Other hyperlipidemia: Secondary | ICD-10-CM

## 2019-11-01 MED ORDER — VENLAFAXINE HCL ER 37.5 MG PO CP24: 37.5000 mg | ORAL_CAPSULE | Freq: Every day | ORAL | 1 refills | Status: DC

## 2019-11-01 MED ORDER — ROSUVASTATIN CALCIUM 10 MG PO TABS: ORAL_TABLET | ORAL | 6 refills | Status: DC

## 2019-11-01 MED ORDER — IRBESARTAN 150 MG PO TABS: 225.0000 mg | ORAL_TABLET | Freq: Every day | ORAL | 1 refills | Status: DC

## 2019-11-01 NOTE — Patient Instructions (Signed)
Physical January 12th with fasting labs.   I have refilled your medications for you.  Sorry you had so much trouble with the refill of Effexor.

## 2019-11-01 NOTE — Progress Notes (Signed)
Brandy Bowman , Aug 13, 1950, 69 y.o., female MRN: [id number] Patient Care Team    Relationship Specialty Notifications Start End  Natalia Leatherwood, DO PCP - General Family Medicine  03/08/16     Chief Complaint  Patient presents with  . Hypertension    Needs refills on all meds   . Depression     Subjective: Brandy Bowman is a 69 y.o. present for [facility] followup Essential hypertension, benign//hyperlipidemia/morbid obesity:  Pt reports compliance with Crestor 10 mg, fish oil supplement and irbesartan 225 mg. Blood pressures ranges at home are WNL. Patient denies chest pain, shortness of breath, dizziness or lower extremity edema.  Pt is not on  ASA 2/2 daily naproxen use. Pt is prescribed statin. Labs  Labs utd 04/2019 Diet: Low sodium,  Very little meat consumption  Exercise: Not exercising routinely.  RF: HTN, HLD, No Fhx    Depressive disorder:  Patient reports she is doing well  on current dose of Effexor 37.5 mg.  She does need refills today.  Her stress surrounds the assistance and care for her husband.   Depression screen [facility] 2/9 11/01/2019 04/13/2019 04/16/2018 04/10/2017 03/11/2017  Decreased Interest 0 0 1 0 1  Down, Depressed, Hopeless 3 0 1 1 1   PHQ - 2 Score 3 0 2 1 2   Altered sleeping 0 0 0 0 0  Tired, decreased energy 1 1 2 1 2   Change in appetite 0 0 0 0 0  Feeling bad or failure about yourself  0 0 0 0 1  Trouble concentrating 0 0 0 0 0  Moving slowly or fidgety/restless 0 0 0 0 0  Suicidal thoughts 0 0 0 0 0  PHQ-9 Score 4 1 4 2 5   Difficult doing work/chores Not difficult at all Not difficult at all Not difficult at all - Not difficult at all   GAD 7 : Generalized Anxiety Score 04/16/2018  Nervous, Anxious, on Edge 1  Control/stop worrying 1  Worry too much - different things 1  Trouble relaxing 0  Restless 0  Easily annoyed or irritable 2  Afraid - awful might happen 0  Total GAD 7 Score 5  Anxiety Difficulty Not difficult at all   No Known  Allergies Social History   Tobacco Use  . Smoking status: Never Smoker  . Smokeless tobacco: Never Used  Substance Use Topics  . Alcohol use: No   Past Medical History:  Diagnosis Date  . Allergy   . Anxiety   . Arthritis   . Depression   . Hyperlipidemia   . Hypertension   . IFG (impaired fasting glucose)    A1c's all 5.9% or less  . Vitamin D deficiency    Past Surgical History:  Procedure Laterality Date  . TUBAL LIGATION     Family History  Problem Relation Age of Onset  . Dementia Mother   . Pneumonia Father   . Rheum arthritis Paternal Uncle    Allergies as of 11/01/2019   No Known Allergies     Medication List       Accurate as of November 01, 2019  2:12 PM. If you have any questions, ask your nurse or doctor.        STOP taking these medications   polycarbophil 625 MG tablet Commonly known as: FIBERCON Stopped by: , DO     TAKE these medications   CENTRUM ULTRA WOMENS PO Take by mouth daily.   FISH OIL PO Take by  mouth.   irbesartan 150 MG tablet Commonly known as: AVAPRO Take 1.5 tablets (225 mg total) by mouth at bedtime.   naproxen sodium 220 MG tablet Commonly known as: ALEVE Take 220 mg by mouth in the morning and at bedtime.   Nasacort Allergy 24HR 55 MCG/ACT Aero nasal inhaler Generic drug: triamcinolone Place 2 sprays into the nose as needed.   rosuvastatin 10 MG tablet Commonly known as: CRESTOR TAKE 1 TABLET BY MOUTH MONDAY THROUGH FRIDAY   venlafaxine XR 37.5 MG 24 hr capsule Commonly known as: EFFEXOR-XR Take 1 capsule (37.5 mg total) by mouth daily.   VITAMIN D-3 PO Take 2,000 Int'l Units by mouth daily.   Voltaren 1 % Gel Generic drug: diclofenac Sodium Apply topically 4 (four) times daily.       All past medical history, surgical history, allergies, family history, immunizations andmedications were updated in the EMR today and reviewed under the history and medication portions of their EMR.     ROS:  Negative, with the exception of above mentioned in HPI   Objective:  BP (!) 131/82 (BP Location: Left Arm, Patient Position: Sitting, Cuff Size: Large)   Pulse 80   Temp 98.2 F (36.8 C) (Temporal)   Resp 18   Ht 5\' 4"  (1.626 m)   Wt (!) 249 lb 6 oz (113.1 kg)   SpO2 99%   BMI 42.81 kg/m  Body mass index is 42.81 kg/m. Gen: Afebrile. No acute distress. Pleasant female. Obese.  HENT: AT. [place].  Eyes:Pupils Equal Round Reactive to light, Extraocular movements intact,  Conjunctiva without redness, discharge or icterus. Neck/lymp/endocrine: Supple,no lymphadenopathy, no thyromegaly CV: RRR no murmur, no edema Chest: CTAB, no wheeze or crackles Neuro:  Normal gait. PERLA. EOMi. Alert. Oriented x3  Psych: Normal affect, dress and demeanor. Normal speech. Normal thought content and judgment.   No exam data present No results found. No results found for this or any previous visit (from the past 24 hour(s)).  Assessment/Plan: Brandy Bowman is a 69 y.o. female present for OV for  Essential hypertension, benign/HLD/morbid obesity/eleavted a1c - STABLE.   - continue  irbesartan to 225 mg (1.5 tabs ) - Watch added salt and exercise > 150 minutes a week  - continue  crestor 10 mg QD - No ASA use --> takes daily naproxen - F/U 5.5 months.  Unless needed sooner  Depressive disorder Stable.  Continue  Effexor 37.5 mg.  - f/u q 5.5  months.   Vit d def: - supplementing 3000u daily. Last level check 10/15/2018 increased to 3000 units at that time secondary to borderline level of 30. Check yearly with other labs.     Reviewed expectations re: course of current medical issues.  Discussed self-management of symptoms.  Outlined signs and symptoms indicating need for more acute intervention.  Patient verbalized understanding and all questions were answered.  Patient received an After-Visit Summary.    No orders of the defined types were placed in this encounter.  Meds ordered  this encounter  Medications  . venlafaxine XR (EFFEXOR-XR) 37.5 MG 24 hr capsule    Sig: Take 1 capsule (37.5 mg total) by mouth daily.    Dispense:  90 capsule    Refill:  1  . rosuvastatin (CRESTOR) 10 MG tablet    Sig: TAKE 1 TABLET BY MOUTH MONDAY THROUGH FRIDAY    Dispense:  24 tablet    Refill:  6  . irbesartan (AVAPRO) 150 MG tablet    Sig: Take  1.5 tablets (225 mg total) by mouth at bedtime.    Dispense:  135 tablet    Refill:  1     Note is dictated utilizing voice recognition software. Although note has been proof read prior to signing, occasional typographical errors still can be missed. If any questions arise, please do not hesitate to call for verification.   electronically signed by:  Felix Pacini, DO  [facility] [place]

## 2019-11-16 ENCOUNTER — Other Ambulatory Visit: Payer: Self-pay | Admitting: Family Medicine

## 2019-11-16 DIAGNOSIS — E7849 Other hyperlipidemia: Secondary | ICD-10-CM

## 2019-12-20 ENCOUNTER — Ambulatory Visit (INDEPENDENT_AMBULATORY_CARE_PROVIDER_SITE_OTHER): Payer: Medicare Other | Admitting: Family Medicine

## 2019-12-20 ENCOUNTER — Other Ambulatory Visit: Payer: Self-pay

## 2019-12-20 DIAGNOSIS — Z23 Encounter for immunization: Secondary | ICD-10-CM

## 2020-02-16 ENCOUNTER — Ambulatory Visit (INDEPENDENT_AMBULATORY_CARE_PROVIDER_SITE_OTHER): Payer: Medicare Other

## 2020-02-16 VITALS — Ht 64.0 in | Wt 249.0 lb

## 2020-02-16 DIAGNOSIS — Z0000 Encounter for general adult medical examination without abnormal findings: Secondary | ICD-10-CM

## 2020-02-16 NOTE — Patient Instructions (Signed)
Ms. Brandy Bowman , Thank you for taking time to complete your Medicare Wellness Visit. I appreciate your ongoing commitment to your health goals. Please review the following plan we discussed and let me know if I can assist you in the future.   Screening recommendations/referrals: Colonoscopy: Completed 04/09/2011- Due-04/08/2021 Mammogram: Declined at this time. Please call the office to schedule if you change your mind. Bone Density: Declined at this time. Please call the office to schedule if you change your mind. Recommended yearly ophthalmology/optometry visit for glaucoma screening and checkup Recommended yearly dental visit for hygiene and checkup  Vaccinations: Influenza vaccine: Up to date Pneumococcal vaccine: Completed vaccines Tdap vaccine: Up to date- Due-04/08/2021 Shingles vaccine: Discuss with pharmacy   Covid-19:Completed vaccnes  Advanced directives: Please bring a copy for your chart  Conditions/risks identified:See problem list  Next appointment: Follow up in one year for your annual wellness visit    Preventive Care 65 Years and Older, Female Preventive care refers to lifestyle choices and visits with your health care provider that can promote health and wellness. What does preventive care include?  A yearly physical exam. This is also called an annual well check.  Dental exams once or twice a year.  Routine eye exams. Ask your health care provider how often you should have your eyes checked.  Personal lifestyle choices, including:  Daily care of your teeth and gums.  Regular physical activity.  Eating a healthy diet.  Avoiding tobacco and drug use.  Limiting alcohol use.  Practicing safe sex.  Taking low-dose aspirin every day.  Taking vitamin and mineral supplements as recommended by your health care provider. What happens during an annual well check? The services and screenings done by your health care provider during your annual well check will  depend on your age, overall health, lifestyle risk factors, and family history of disease. Counseling  Your health care provider may ask you questions about your:  Alcohol use.  Tobacco use.  Drug use.  Emotional well-being.  Home and relationship well-being.  Sexual activity.  Eating habits.  History of falls.  Memory and ability to understand (cognition).  Work and work Astronomer.  Reproductive health. Screening  You may have the following tests or measurements:  Height, weight, and BMI.  Blood pressure.  Lipid and cholesterol levels. These may be checked every 5 years, or more frequently if you are over 65 years old.  Skin check.  Lung cancer screening. You may have this screening every year starting at age 91 if you have a 30-pack-year history of smoking and currently smoke or have quit within the past 15 years.  Fecal occult blood test (FOBT) of the stool. You may have this test every year starting at age 72.  Flexible sigmoidoscopy or colonoscopy. You may have a sigmoidoscopy every 5 years or a colonoscopy every 10 years starting at age 58.  Hepatitis C blood test.  Hepatitis B blood test.  Sexually transmitted disease (STD) testing.  Diabetes screening. This is done by checking your blood sugar (glucose) after you have not eaten for a while (fasting). You may have this done every 1-3 years.  Bone density scan. This is done to screen for osteoporosis. You may have this done starting at age 72.  Mammogram. This may be done every 1-2 years. Talk to your health care provider about how often you should have regular mammograms. Talk with your health care provider about your test results, treatment options, and if necessary, the need for more  tests. Vaccines  Your health care provider may recommend certain vaccines, such as:  Influenza vaccine. This is recommended every year.  Tetanus, diphtheria, and acellular pertussis (Tdap, Td) vaccine. You may need a  Td booster every 10 years.  Zoster vaccine. You may need this after age 73.  Pneumococcal 13-valent conjugate (PCV13) vaccine. One dose is recommended after age 31.  Pneumococcal polysaccharide (PPSV23) vaccine. One dose is recommended after age 33. Talk to your health care provider about which screenings and vaccines you need and how often you need them. This information is not intended to replace advice given to you by your health care provider. Make sure you discuss any questions you have with your health care provider. Document Released: 04/21/2015 Document Revised: 12/13/2015 Document Reviewed: 01/24/2015 Elsevier Interactive Patient Education  2017 [place] Prevention in the Home Falls can cause injuries. They can happen to people of all ages. There are many things you can do to make your home safe and to help prevent falls. What can I do on the outside of my home?  Regularly fix the edges of walkways and driveways and fix any cracks.  Remove anything that might make you trip as you walk through a door, such as a raised step or threshold.  Trim any bushes or trees on the path to your home.  Use bright outdoor lighting.  Clear any walking paths of anything that might make someone trip, such as rocks or tools.  Regularly check to see if handrails are loose or broken. Make sure that both sides of any steps have handrails.  Any raised decks and porches should have guardrails on the edges.  Have any leaves, snow, or ice cleared regularly.  Use sand or salt on walking paths during winter.  Clean up any spills in your garage right away. This includes oil or grease spills. What can I do in the bathroom?  Use night lights.  Install grab bars by the toilet and in the tub and shower. Do not use towel bars as grab bars.  Use non-skid mats or decals in the tub or shower.  If you need to sit down in the shower, use a plastic, non-slip stool.  Keep the floor dry. Clean  up any water that spills on the floor as soon as it happens.  Remove soap buildup in the tub or shower regularly.  Attach bath mats securely with double-sided non-slip rug tape.  Do not have throw rugs and other things on the floor that can make you trip. What can I do in the bedroom?  Use night lights.  Make sure that you have a light by your bed that is easy to reach.  Do not use any sheets or blankets that are too big for your bed. They should not hang down onto the floor.  Have a firm chair that has side arms. You can use this for support while you get dressed.  Do not have throw rugs and other things on the floor that can make you trip. What can I do in the kitchen?  Clean up any spills right away.  Avoid walking on wet floors.  Keep items that you use a lot in easy-to-reach places.  If you need to reach something above you, use a strong step stool that has a grab bar.  Keep electrical cords out of the way.  Do not use floor polish or wax that makes floors slippery. If you must use wax, use non-skid floor  wax.  Do not have throw rugs and other things on the floor that can make you trip. What can I do with my stairs?  Do not leave any items on the stairs.  Make sure that there are handrails on both sides of the stairs and use them. Fix handrails that are broken or loose. Make sure that handrails are as long as the stairways.  Check any carpeting to make sure that it is firmly attached to the stairs. Fix any carpet that is loose or worn.  Avoid having throw rugs at the top or bottom of the stairs. If you do have throw rugs, attach them to the floor with carpet tape.  Make sure that you have a light switch at the top of the stairs and the bottom of the stairs. If you do not have them, ask someone to add them for you. What else can I do to help prevent falls?  Wear shoes that:  Do not have high heels.  Have rubber bottoms.  Are comfortable and fit you well.  Are  closed at the toe. Do not wear sandals.  If you use a stepladder:  Make sure that it is fully opened. Do not climb a closed stepladder.  Make sure that both sides of the stepladder are locked into place.  Ask someone to hold it for you, if possible.  Clearly mark and make sure that you can see:  Any grab bars or handrails.  First and last steps.  Where the edge of each step is.  Use tools that help you move around (mobility aids) if they are needed. These include:  Canes.  Walkers.  Scooters.  Crutches.  Turn on the lights when you go into a dark area. Replace any light bulbs as soon as they burn out.  Set up your furniture so you have a clear path. Avoid moving your furniture around.  If any of your floors are uneven, fix them.  If there are any pets around you, be aware of where they are.  Review your medicines with your doctor. Some medicines can make you feel dizzy. This can increase your chance of falling. Ask your doctor what other things that you can do to help prevent falls. This information is not intended to replace advice given to you by your health care provider. Make sure you discuss any questions you have with your health care provider. Document Released: 01/19/2009 Document Revised: 08/31/2015 Document Reviewed: 04/29/2014 Elsevier Interactive Patient Education  2017 [employer].

## 2020-02-16 NOTE — Progress Notes (Signed)
Subjective:   Brandy Bowman is a 69 y.o. female who presents for Medicare Annual (Subsequent) preventive examination.  I connected with Tamorah today by telephone and verified that I am speaking with the correct person using two identifiers. Location patient: home Location provider: work Persons participating in the virtual visit: patient, Engineer, civil (consulting).    I discussed the limitations, risks, security and privacy concerns of performing an evaluation and management service by telephone and the availability of in person appointments. I also discussed with the patient that there may be a patient responsible charge related to this service. The patient expressed understanding and verbally consented to this telephonic visit.    Interactive audio and video telecommunications were attempted between this provider and patient, however failed, due to patient having technical difficulties OR patient did not have access to video capability.  We continued and completed visit with audio only.  Some vital signs may be absent or patient reported.   Time Spent with patient on telephone encounter: 20 minutes  Review of Systems     Cardiac Risk Factors include: advanced age (>68men, >51 women);hypertension;dyslipidemia;obesity (BMI >30kg/m2);sedentary lifestyle     Objective:    Today's Vitals   02/16/20 1501  Weight: 249 lb (112.9 kg)  Height: 5\' 4"  (1.626 m)   Body mass index is 42.74 kg/m.  Advanced Directives 02/16/2020 03/11/2017 03/08/2016  Does Patient Have a Medical Advance Directive? Yes No No  Type of 14/04/2015 of [place];Living will - -  Copy of Healthcare Power of Attorney in Chart? No - copy requested - -  Would patient like information on creating a medical advance directive? - No - Patient declined Yes (MAU/Ambulatory/Procedural Areas - Information given)    Current Medications (verified) Outpatient Encounter Medications as of 02/16/2020  Medication Sig  .  Cholecalciferol (VITAMIN D-3 PO) Take 2,000 Int'l Units by mouth daily.  . diclofenac Sodium (VOLTAREN) 1 % GEL Apply topically 4 (four) times daily.  . irbesartan (AVAPRO) 150 MG tablet Take 1.5 tablets (225 mg total) by mouth at bedtime.  . Multiple Vitamins-Minerals (CENTRUM ULTRA WOMENS PO) Take by mouth daily.  . naproxen sodium (ALEVE) 220 MG tablet Take 220 mg by mouth in the morning and at bedtime.  . Omega-3 Fatty Acids (FISH OIL PO) Take by mouth.  . triamcinolone (NASACORT ALLERGY 24HR) 55 MCG/ACT AERO nasal inhaler Place 2 sprays into the nose as needed.  . venlafaxine XR (EFFEXOR-XR) 37.5 MG 24 hr capsule Take 1 capsule (37.5 mg total) by mouth daily.  . rosuvastatin (CRESTOR) 10 MG tablet TAKE 1 TABLET BY MOUTH MONDAY THROUGH FRIDAY (Patient not taking: Reported on 02/16/2020)   No facility-administered encounter medications on file as of 02/16/2020.    Allergies (verified) Patient has no known allergies.   History: Past Medical History:  Diagnosis Date  . Allergy   . Anxiety   . Arthritis   . Depression   . Hyperlipidemia   . Hypertension   . IFG (impaired fasting glucose)    A1c's all 5.9% or less  . Vitamin D deficiency    Past Surgical History:  Procedure Laterality Date  . TUBAL LIGATION     Family History  Problem Relation Age of Onset  . Dementia Mother   . Pneumonia Father   . Rheum arthritis Paternal Uncle    Social History   Socioeconomic History  . Marital status: Married    Spouse name: Not on file  . Number of children: 2  . Years  of education: Not on file  . Highest education level: Not on file  Occupational History  . Occupation: retired Agricultural consultant  Tobacco Use  . Smoking status: Never Smoker  . Smokeless tobacco: Never Used  Vaping Use  . Vaping Use: Never used  Substance and Sexual Activity  . Alcohol use: No  . Drug use: No  . Sexual activity: Never  Other Topics Concern  . 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 Currently lives with husband who is several years older and is independent, but has some forgetfulness. Daughter lives in [place]. Does not have contact with son. Enjoys yard work.    Social Determinants of Health   Financial Resource Strain: Low Risk   . Difficulty of Paying Living Expenses: Not hard at all  Food Insecurity: No Food Insecurity  . Worried About Programme researcher, broadcasting/film/video in the Last Year: Never true  . Ran Out of Food in the Last Year: Never true  Transportation Needs: No Transportation Needs  . Lack of Transportation (Medical): No  . Lack of Transportation (Non-Medical): No  Physical Activity: Inactive  . Days of Exercise per Week: 0 days  . Minutes of Exercise per Session: 0 min  Stress: No Stress Concern Present  . Feeling of Stress : Not at all  Social Connections: Socially Isolated  . Frequency of Communication with Friends and Family: Once a week  . Frequency of Social Gatherings with Friends and Family: Never  . Attends Religious Services: Never  . Active Member of Clubs or Organizations: No  . Attends Banker Meetings: Never  . Marital Status: Married    Tobacco Counseling Counseling given: Not Answered   Clinical Intake:  Pre-visit preparation completed: Yes  Pain : No/denies pain     Nutritional Status: BMI > 30  Obese Nutritional Risks: None Diabetes: No  How often do you need to have someone help you when you read instructions, pamphlets, or other written materials from your doctor or pharmacy?: 1 - Never What is the last grade level you completed in school?: Master's degree  Diabetic?No  Interpreter Needed?: No  Information entered by :: Thomasenia Sales LPN   Activities of Daily Living In your present state of health, do you have any difficulty performing the following activities: 02/16/2020  Hearing? N  Vision? N    Difficulty concentrating or making decisions? N  Walking or climbing stairs? N  Dressing or bathing? N  Doing errands, shopping? N  Preparing Food and eating ? N  Using the Toilet? N  In the past six months, have you accidently leaked urine? N  Do you have problems with loss of bowel control? N  Managing your Medications? N  Managing your Finances? N  Housekeeping or managing your Housekeeping? N  Some recent data might be hidden    Patient Care Team: Natalia Leatherwood, DO as PCP - General (Family Medicine)  Indicate any recent Medical Services you may have received from other than [facility] providers in the past year (date may be approximate).     Assessment:   This is a routine wellness examination for [place].  Hearing/Vision screen  Hearing Screening   125Hz  250Hz  500Hz  1000Hz  2000Hz  3000Hz  4000Hz  6000Hz  8000Hz   Right ear:           Left ear:  Comments: No issues  Vision Screening Comments: Wears glasses Last eye exam-2019  Dietary issues and exercise activities discussed: Exercise limited by: None identified  Goals    . Patient Stated     Would like to lose weight      Depression Screen PHQ 2/9 Scores 02/16/2020 11/01/2019 04/13/2019 04/16/2018 04/10/2017 03/11/2017 10/08/2016  PHQ - 2 Score 0 3 0 2 1 2 3   PHQ- 9 Score - 4 1 4 2 5 4   Exception Documentation - - - Medical reason - - -    Fall Risk Fall Risk  02/16/2020 04/16/2018 03/11/2017 03/08/2016  Falls in the past year? 0 0 No No  Number falls in past yr: 0 0 - -  Injury with Fall? 0 0 - -    Any stairs in or around the home? Yes  If so, are there any without handrails? No  Home free of loose throw rugs in walkways, pet beds, electrical cords, etc? Yes  Adequate lighting in your home to reduce risk of falls? Yes   ASSISTIVE DEVICES UTILIZED TO PREVENT FALLS:  Life alert? No  Use of a cane, walker or w/c? No  Grab bars in the bathroom? Yes  Shower chair or bench in shower? No  Elevated toilet seat or a  handicapped toilet? No   TIMED UP AND GO:  Was the test performed? No .phone visit    Cognitive Function:No cognitive impairment noted        Immunizations Immunization History  Administered Date(s) Administered  . Fluad Quad(high Dose 65+) 12/16/2018, 12/20/2019  . Influenza Split 12/28/2009  . Influenza, High Dose Seasonal PF 01/05/2016, 01/20/2017, 01/28/2018  . Influenza,inj,Quad PF,6+ Mos 01/13/2014, 01/06/2015  . PFIZER SARS-COV-2 Vaccination 05/15/2019, 06/09/2019  . Pneumococcal Conjugate-13 09/07/2015  . Pneumococcal Polysaccharide-23 04/10/2017  . Tdap 04/09/2011    TDAP status: Up to date   Flu Vaccine status: Up to date   Pneumococcal vaccine status: Up to date   Covid-19 vaccine status: Completed vaccines  Qualifies for Shingles Vaccine? Yes   Zostavax completed No   Shingrix Completed?: No.    Education has been provided regarding the importance of this vaccine. Patient has been advised to call insurance company to determine out of pocket expense if they have not yet received this vaccine. Advised may also receive vaccine at local pharmacy or Health Dept. Verbalized acceptance and understanding.  Screening Tests Health Maintenance  Topic Date Due  . MAMMOGRAM  06/07/2016  . DEXA SCAN  09/27/2019  . COLONOSCOPY  04/08/2021  . TETANUS/TDAP  04/08/2021  . INFLUENZA VACCINE  Completed  . COVID-19 Vaccine  Completed  . Hepatitis C Screening  Completed  . PNA vac Low Risk Adult  Completed    Health Maintenance  Health Maintenance Due  Topic Date Due  . MAMMOGRAM  06/07/2016  . DEXA SCAN  09/27/2019    Colorectal cancer screening: Completed Colonoscopy 04/09/2011. Repeat every 10 years   Mammogram status: Declined  Bone Density status: Declined  Lung Cancer Screening: (Low Dose CT Chest recommended if Age 51-80 years, 30 pack-year currently smoking OR have quit w/in 15years.) does not qualify.    Additional Screening:  Hepatitis C Screening:   Completed 09/07/2015  Vision Screening: Recommended annual ophthalmology exams for early detection of glaucoma and other disorders of the eye. Is the patient up to date with their annual eye exam?  No  Who is the provider or what is the name of the office in which the patient  attends annual eye exams? [facility]   Dental Screening: Recommended annual dental exams for proper oral hygiene  Community Resource Referral / Chronic Care Management: CRR required this visit?  No   CCM required this visit?  No      Plan:     I have personally reviewed and noted the following in the patient's chart:   . Medical and social history . Use of alcohol, tobacco or illicit drugs  . Current medications and supplements . Functional ability and status . Nutritional status . Physical activity . Advanced directives . List of other physicians . Hospitalizations, surgeries, and ER visits in previous 12 months . Vitals . Screenings to include cognitive, depression, and falls . Referrals and appointments  In addition, I have reviewed and discussed with patient certain preventive protocols, quality metrics, and best practice recommendations. A written personalized care plan for preventive services as well as general preventive health recommendations were provided to patient.   Due to this being a telephonic visit, the after visit summary with patients personalized plan was offered to patient via mail or my-chart. Patient would like to access on my-chart.   Roanna Raider, LPN   [id number]  Nurse Health Advisor  Nurse Notes: None

## 2020-05-08 ENCOUNTER — Other Ambulatory Visit: Payer: Self-pay | Admitting: Family Medicine

## 2020-05-08 DIAGNOSIS — F32A Depression, unspecified: Secondary | ICD-10-CM

## 2020-05-08 DIAGNOSIS — F329 Major depressive disorder, single episode, unspecified: Secondary | ICD-10-CM

## 2020-05-30 ENCOUNTER — Other Ambulatory Visit: Payer: Self-pay | Admitting: Family Medicine

## 2020-05-30 DIAGNOSIS — F329 Major depressive disorder, single episode, unspecified: Secondary | ICD-10-CM

## 2020-05-30 DIAGNOSIS — F32A Depression, unspecified: Secondary | ICD-10-CM

## 2020-06-19 ENCOUNTER — Other Ambulatory Visit: Payer: Self-pay

## 2020-06-20 ENCOUNTER — Encounter: Payer: Self-pay | Admitting: Family Medicine

## 2020-06-20 ENCOUNTER — Ambulatory Visit (INDEPENDENT_AMBULATORY_CARE_PROVIDER_SITE_OTHER): Payer: Medicare Other | Admitting: Family Medicine

## 2020-06-20 VITALS — BP 132/84 | HR 78 | Temp 99.1°F | Ht 64.0 in | Wt 240.0 lb

## 2020-06-20 DIAGNOSIS — I10 Essential (primary) hypertension: Secondary | ICD-10-CM

## 2020-06-20 DIAGNOSIS — E782 Mixed hyperlipidemia: Secondary | ICD-10-CM

## 2020-06-20 DIAGNOSIS — R7309 Other abnormal glucose: Secondary | ICD-10-CM | POA: Insufficient documentation

## 2020-06-20 DIAGNOSIS — F329 Major depressive disorder, single episode, unspecified: Secondary | ICD-10-CM | POA: Diagnosis not present

## 2020-06-20 DIAGNOSIS — E559 Vitamin D deficiency, unspecified: Secondary | ICD-10-CM

## 2020-06-20 DIAGNOSIS — F32A Depression, unspecified: Secondary | ICD-10-CM

## 2020-06-20 LAB — COMPREHENSIVE METABOLIC PANEL WITH GFR
ALT: 17 U/L (ref 0–35)
AST: 16 U/L (ref 0–37)
Albumin: 4 g/dL (ref 3.5–5.2)
Alkaline Phosphatase: 81 U/L (ref 39–117)
BUN: 12 mg/dL (ref 6–23)
CO2: 29 meq/L (ref 19–32)
Calcium: 9.5 mg/dL (ref 8.4–10.5)
Chloride: 103 meq/L (ref 96–112)
Creatinine, Ser: 0.53 mg/dL (ref 0.40–1.20)
GFR: 93.81 mL/min
Glucose, Bld: 77 mg/dL (ref 70–99)
Potassium: 4 meq/L (ref 3.5–5.1)
Sodium: 140 meq/L (ref 135–145)
Total Bilirubin: 0.5 mg/dL (ref 0.2–1.2)
Total Protein: 6.8 g/dL (ref 6.0–8.3)

## 2020-06-20 LAB — LDL CHOLESTEROL, DIRECT: Direct LDL: 141 mg/dL

## 2020-06-20 LAB — CBC
HCT: 39.5 % (ref 36.0–46.0)
Hemoglobin: 13.4 g/dL (ref 12.0–15.0)
MCHC: 33.9 g/dL (ref 30.0–36.0)
MCV: 89.4 fl (ref 78.0–100.0)
Platelets: 280 10*3/uL (ref 150.0–400.0)
RBC: 4.42 Mil/uL (ref 3.87–5.11)
RDW: 13.4 % (ref 11.5–15.5)
WBC: 7.5 10*3/uL (ref 4.0–10.5)

## 2020-06-20 LAB — LIPID PANEL
Cholesterol: 217 mg/dL — ABNORMAL HIGH (ref 0–200)
HDL: 44.9 mg/dL
NonHDL: 172.2
Total CHOL/HDL Ratio: 5
Triglycerides: 209 mg/dL — ABNORMAL HIGH (ref 0.0–149.0)
VLDL: 41.8 mg/dL — ABNORMAL HIGH (ref 0.0–40.0)

## 2020-06-20 LAB — HEMOGLOBIN A1C: Hgb A1c MFr Bld: 5.5 % (ref 4.6–6.5)

## 2020-06-20 LAB — TSH: TSH: 1.9 u[IU]/mL (ref 0.35–4.50)

## 2020-06-20 MED ORDER — VENLAFAXINE HCL ER 75 MG PO CP24: ORAL_CAPSULE | ORAL | 1 refills | Status: DC

## 2020-06-20 MED ORDER — IRBESARTAN 300 MG PO TABS
300.0000 mg | ORAL_TABLET | Freq: Every day | ORAL | 1 refills | Status: DC
Start: 2020-06-20 — End: 2020-12-07

## 2020-06-20 NOTE — Patient Instructions (Signed)
See you in September.  Increase irbesartan to 300 mg a day. New script will be 300 mg per tab.  Increased effexor to 75 mg a day. New script sent.

## 2020-06-20 NOTE — Progress Notes (Signed)
Brandy Bowman , [birth year], 70 y.o., female MRN: [id number] Patient Care Team    Relationship Specialty Notifications Start End  Natalia Leatherwood, DO PCP - General Family Medicine  03/08/16     Chief Complaint  Patient presents with  . Follow-up    [facility]; pt is not fasting      Subjective: Brandy Bowman is a 70 y.o. present for [name] followup Essential hypertension, benign//hyperlipidemia/morbid obesity:  Pt reports compliance with Crestor 10 mg, fish oil supplement and irbesartan 225 mg. Blood pressures ranges at home are not routinely checked since her husband passed away. Patient denies chest pain, shortness of breath, dizziness or lower extremity edema.  Pt is not on  ASA 2/2 daily naproxen use. Pt is prescribed statin. Diet: Low sodium,  Very little meat consumption  Exercise: Not exercising routinely.  RF: HTN, HLD, No Fhx    Depressive disorder:  Patient reports she was doing well on the Effexor 37.5 mg daily until her husband passed away a couple weeks ago.  She is since having to fill out a lot of paperwork concerning his death and she is selling her home as well.  The grief surrounding his death in the increased anxiety with managing the paperwork and household has caused her some increased concern and she would like to consider increasing her Effexor today.  Her daughter Brandy Bowman is supportive for her and is now her caregiver.  She provides her with meals daily and is with her on the weekends as well.  Depression screen [facility] 2/9 06/20/2020 02/16/2020 11/01/2019 04/13/2019 04/16/2018  Decreased Interest 0 0 0 0 1  Down, Depressed, Hopeless 0 0 3 0 1  PHQ - 2 Score 0 0 3 0 2  Altered sleeping 0 - 0 0 0  Tired, decreased energy 1 - 1 1 2   Change in appetite 0 - 0 0 0  Feeling bad or failure about yourself  0 - 0 0 0  Trouble concentrating 0 - 0 0 0  Moving slowly or fidgety/restless 0 - 0 0 0  Suicidal thoughts 0 - 0 0 0  PHQ-9 Score 1 - 4 1 4   Difficult doing work/chores - -  Not difficult at all Not difficult at all Not difficult at all   GAD 7 : Generalized Anxiety Score 06/20/2020 04/16/2018  Nervous, Anxious, on Edge 1 1  Control/stop worrying 1 1  Worry too much - different things 1 1  Trouble relaxing 0 0  Restless 0 0  Easily annoyed or irritable 0 2  Afraid - awful might happen 0 0  Total GAD 7 Score 3 5  Anxiety Difficulty - Not difficult at all   No Known Allergies Social History   Tobacco Use  . Smoking status: Never Smoker  . Smokeless tobacco: Never Used  Substance Use Topics  . Alcohol use: No   Past Medical History:  Diagnosis Date  . Allergy   . Anxiety   . Arthritis   . Depression   . Hyperlipidemia   . Hypertension   . IFG (impaired fasting glucose)    A1c's all 5.9% or less  . Vitamin D deficiency    Past Surgical History:  Procedure Laterality Date  . TUBAL LIGATION     Family History  Problem Relation Age of Onset  . Dementia Mother   . Pneumonia Father   . Rheum arthritis Paternal Uncle    Allergies as of 06/20/2020   No Known Allergies  Medication List       Accurate as of June 20, 2020 11:13 AM. If you have any questions, ask your nurse or doctor.        CENTRUM ULTRA WOMENS PO Take by mouth daily.   diclofenac Sodium 1 % Gel Commonly known as: VOLTAREN Apply topically 4 (four) times daily.   FISH OIL PO Take by mouth.   irbesartan 150 MG tablet Commonly known as: AVAPRO Take 1.5 tablets (225 mg total) by mouth at bedtime.   naproxen sodium 220 MG tablet Commonly known as: ALEVE Take 220 mg by mouth in the morning and at bedtime.   rosuvastatin 10 MG tablet Commonly known as: CRESTOR TAKE 1 TABLET BY MOUTH MONDAY THROUGH FRIDAY   triamcinolone 55 MCG/ACT Aero nasal inhaler Commonly known as: NASACORT Place 2 sprays into the nose as needed.   venlafaxine XR 37.5 MG 24 hr capsule Commonly known as: EFFEXOR-XR TAKE 1 CAPSULE BY MOUTH EVERY DAY   VITAMIN D-3 PO Take 2,000 Int'l Units  by mouth daily.       All past medical history, surgical history, allergies, family history, immunizations andmedications were updated in the EMR today and reviewed under the history and medication portions of their EMR.     ROS: Negative, with the exception of above mentioned in HPI   Objective:  BP 132/84   Pulse 78   Temp 99.1 F (37.3 C) (Oral)   Ht 5\' 4"  (1.626 m)   Wt 240 lb (108.9 kg)   SpO2 97%   BMI 41.20 kg/m  Body mass index is 41.2 kg/m. Gen: Afebrile. No acute distress.  Nontoxic, pleasant female.  Obese  HENT: AT. [place].  Eyes:Pupils Equal Round Reactive to light, Extraocular movements intact,  Conjunctiva without redness, discharge or icterus. Neck/lymp/endocrine: Supple, no lymphadenopathy, no thyromegaly CV: RRR no murmur, no edema, +2/4 P posterior tibialis pulses Chest: CTAB, no wheeze or crackles  Neuro: Ambulates well with walker. PERLA. EOMi. Alert. Oriented x3 Psych: Normal affect, dress and demeanor. Normal speech. Normal thought content and judgment.   No exam data present No results found. No results found for this or any previous visit (from the past 24 hour(s)).  Assessment/Plan: [name] is a 70 y.o. female present for OV for  Essential hypertension, benign/HLD/morbid obesity - Borderline blood pressure -Increase irbesartan from 225 mg daily to 300 mg daily - Watch added salt and exercise > 150 minutes a week  -Continue crestor 10 mg QD - No ASA use --> takes daily naproxen CBC, CMP, TSH and lipids collected today - F/U 5.5 months.    Elevated A1c: A1c collected today  Depressive disorder Under more stress and grieving since her husband's passing in February. Increase Effexor to 75 mg daily - f/u q 5.5  months.  Sooner if needed  Vit d def: - supplementing 3000u daily.    Reviewed expectations re: course of current medical issues.  Discussed self-management of symptoms.  Outlined signs and symptoms indicating need for  more acute intervention.  Patient verbalized understanding and all questions were answered.  Patient received an After-Visit Summary.    No orders of the defined types were placed in this encounter.  No orders of the defined types were placed in this encounter.    Note is dictated utilizing voice recognition software. Although note has been proof read prior to signing, occasional typographical errors still can be missed. If any questions arise, please do not hesitate to call for verification.  electronically signed by:  Howard Pouch, DO  [place]

## 2020-06-21 ENCOUNTER — Other Ambulatory Visit: Payer: Self-pay | Admitting: Family Medicine

## 2020-06-21 DIAGNOSIS — E7849 Other hyperlipidemia: Secondary | ICD-10-CM

## 2020-06-21 MED ORDER — ROSUVASTATIN CALCIUM 10 MG PO TABS: ORAL_TABLET | ORAL | 6 refills | Status: DC

## 2020-06-21 NOTE — Progress Notes (Signed)
LVM for pt to CB in regards to letter. Sending to [facility] for now until pt cb with new address if still need sent via mail.

## 2020-06-23 ENCOUNTER — Telehealth: Payer: Self-pay | Admitting: Family Medicine

## 2020-06-23 NOTE — Telephone Encounter (Signed)
Letter sent.

## 2020-06-23 NOTE — Telephone Encounter (Signed)
Patient called regarding letter sent 06/21/20. She states it needs the following corrections: Misspelled her husbands name, correct should be Olaff Add "outside the" before "[place]." Patient would like revised letter sent to her asap.

## 2020-06-23 NOTE — Telephone Encounter (Signed)
Ok

## 2020-06-23 NOTE — Telephone Encounter (Signed)
Letter on providers desk.

## 2020-07-05 ENCOUNTER — Other Ambulatory Visit: Payer: Self-pay | Admitting: Family Medicine

## 2020-07-05 DIAGNOSIS — I10 Essential (primary) hypertension: Secondary | ICD-10-CM

## 2020-07-07 ENCOUNTER — Encounter: Payer: Self-pay | Admitting: Family Medicine

## 2020-07-11 ENCOUNTER — Encounter: Payer: Self-pay | Admitting: Family Medicine

## 2020-07-11 ENCOUNTER — Telehealth: Payer: Self-pay

## 2020-07-11 NOTE — Telephone Encounter (Signed)
Form received and under review. Given to [place] to help assist.

## 2020-07-11 NOTE — Telephone Encounter (Signed)
Please advise if appt is needed. Letter will be placed on providers desk

## 2020-07-11 NOTE — Telephone Encounter (Signed)
FMLA form received and placed on providers desk to be under review.

## 2020-07-11 NOTE — Telephone Encounter (Signed)
Patient sent letter to Dr. Claiborne Billings to ask for sales tax refund for stair lift.  Placed in Kuneff's inbox in front office.  Please call patient [phone].

## 2020-07-12 NOTE — Telephone Encounter (Signed)
Spoke with pt and informed her of script being faxed. Pt stated she did not need an copy.   As far as FMLA pt stated that her daughter was asking for leave for those dates until her lease is up and she can move in with the pt. Pt had written down these questions to confirm with daughter

## 2020-07-12 NOTE — Telephone Encounter (Signed)
Please inform patient I have completed her stair lift prescription request.  -The paperwork states that the physician may email prescriptions along with the form to [email or web address] or fax to [phone].  Reason paperwork to [employer] still lives ASAP.  Patient can pick up her original paperwork for her records.   I am working on her daughter's FMLA for her.  Please remind her turnaround time if she inquires.   -I do have some questions that I will need answered in order to fill out paperwork appropriately for them.       1.  The timeline Amy filled out for that.  In which she is requiring any sort of leave was from 06/30/2020 through 9/22 2022.  Was the reason why she chose those dates and timing of 6 months?      2.  I agreed to fill out the paperwork for [place] in the hope that her daughter's employer would allow Amy (her daughter), to not travel out of the state on weekends and be able to leave work urgently if needed by her mother, since her daughter is now the sole caregiver for her.  Although FMLA paperwork is required to be filled out to protect Amy, their situation does not perfectly fit into the way the FMLA questions and available multiple-choice answers for me to choose from, are provided.  I will do my best and attach the addendum letter that we had written prior to the [facility] paperwork once completed to better explain the situation.  Once all paperwork is completed we will call her to keep her informed.  If she inquires please remind her of the turnaround time for paperwork and we will work on as quickly as we can.  Thanks

## 2020-07-13 ENCOUNTER — Encounter: Payer: Self-pay | Admitting: Family Medicine

## 2020-07-13 DIAGNOSIS — R269 Unspecified abnormalities of gait and mobility: Secondary | ICD-10-CM | POA: Insufficient documentation

## 2020-07-13 DIAGNOSIS — Z7409 Other reduced mobility: Secondary | ICD-10-CM | POA: Insufficient documentation

## 2020-07-13 NOTE — Telephone Encounter (Signed)
Placed at front desk. Copy sent to scan

## 2020-07-13 NOTE — Telephone Encounter (Signed)
Pt inquired about forms via mychart and was asked if would like to have faxed or picked in response

## 2020-07-13 NOTE — Telephone Encounter (Signed)
Please inform patient I have completed the FMLA paperwork to the best of our abilities.  Copy original and send the scan. See if patient was to pick up the packet or have it faxed etc.

## 2020-10-11 ENCOUNTER — Encounter: Payer: Self-pay | Admitting: Family Medicine

## 2020-10-11 NOTE — Telephone Encounter (Signed)
Please draft a letter for patient -to whom it may concern, to consider her for medical exemption for jury duty secondary to her chronic medical conditions and decreased mobility.    Thank you.

## 2020-12-12 ENCOUNTER — Other Ambulatory Visit: Payer: Self-pay | Admitting: Family Medicine

## 2020-12-12 DIAGNOSIS — I10 Essential (primary) hypertension: Secondary | ICD-10-CM

## 2020-12-12 DIAGNOSIS — F329 Major depressive disorder, single episode, unspecified: Secondary | ICD-10-CM

## 2020-12-12 DIAGNOSIS — F32A Depression, unspecified: Secondary | ICD-10-CM

## 2020-12-14 ENCOUNTER — Ambulatory Visit: Payer: Medicare Other | Admitting: Family Medicine

## 2020-12-19 ENCOUNTER — Other Ambulatory Visit: Payer: Self-pay

## 2020-12-19 ENCOUNTER — Encounter: Payer: Self-pay | Admitting: Family Medicine

## 2020-12-19 ENCOUNTER — Ambulatory Visit (INDEPENDENT_AMBULATORY_CARE_PROVIDER_SITE_OTHER): Payer: Medicare Other | Admitting: Family Medicine

## 2020-12-19 DIAGNOSIS — Z7409 Other reduced mobility: Secondary | ICD-10-CM

## 2020-12-19 DIAGNOSIS — E7849 Other hyperlipidemia: Secondary | ICD-10-CM | POA: Diagnosis not present

## 2020-12-19 DIAGNOSIS — I10 Essential (primary) hypertension: Secondary | ICD-10-CM

## 2020-12-19 DIAGNOSIS — F32A Depression, unspecified: Secondary | ICD-10-CM

## 2020-12-19 DIAGNOSIS — E559 Vitamin D deficiency, unspecified: Secondary | ICD-10-CM

## 2020-12-19 DIAGNOSIS — F329 Major depressive disorder, single episode, unspecified: Secondary | ICD-10-CM

## 2020-12-19 MED ORDER — IRBESARTAN 300 MG PO TABS: 300.0000 mg | ORAL_TABLET | Freq: Every day | ORAL | 1 refills | Status: DC

## 2020-12-19 MED ORDER — ROSUVASTATIN CALCIUM 10 MG PO TABS: ORAL_TABLET | ORAL | 6 refills | Status: DC

## 2020-12-19 MED ORDER — VENLAFAXINE HCL ER 75 MG PO CP24: ORAL_CAPSULE | ORAL | 1 refills | Status: DC

## 2020-12-19 NOTE — Progress Notes (Signed)
Brandy Bowman , 1950/07/08, 70 y.o., female MRN: [id number] Patient Care Team    Relationship Specialty Notifications Start End  Natalia Leatherwood, DO PCP - General Family Medicine  03/08/16     Chief Complaint  Patient presents with   [facility]    F/u hypertension     Subjective: Brandy Bowman is a 70 y.o. present for [facility] followup Essential hypertension, benign//hyperlipidemia/morbid obesity:  Pt reports compliance with Crestor 10 mg, fish oil supplement and irbesartan 300 mg. Blood pressures ranges at home are not routinely checked since her husband passed away. Patient denies chest pain, shortness of breath, dizziness or lower extremity edema.  Pt is not on  ASA 2/2 daily naproxen use. Pt is prescribed statin. Diet: Low sodium,  Very little meat consumption  Exercise: Not exercising routinely.  RF: HTN, HLD, No Fhx    Depressive disorder:  Patient reports she is feeling very well with Effexor 75 mg .daily until her husband passed away in 06/07/22 but reports things are going very well for her now.  Her daughter has moved in with her and they are adjusting to living together and she feels that it is a good situation.   Her daughter Amy is supportive for her and is now her caregiver.   Depression screen [facility] 2/9 12/19/2020 06/20/2020 02/16/2020 11/01/2019 04/13/2019  Decreased Interest 0 0 0 0 0  Down, Depressed, Hopeless 1 0 0 3 0  PHQ - 2 Score 1 0 0 3 0  Altered sleeping 0 0 - 0 0  Tired, decreased energy 1 1 - 1 1  Change in appetite 0 0 - 0 0  Feeling bad or failure about yourself  0 0 - 0 0  Trouble concentrating 0 0 - 0 0  Moving slowly or fidgety/restless 0 0 - 0 0  Suicidal thoughts 0 0 - 0 0  PHQ-9 Score 2 1 - 4 1  Difficult doing work/chores - - - Not difficult at all Not difficult at all   GAD 7 : Generalized Anxiety Score 12/19/2020 06/20/2020 04/16/2018  Nervous, Anxious, on Edge 1 1 1   Control/stop worrying 0 1 1  Worry too much - different things 1 1 1   Trouble  relaxing 0 0 0  Restless 0 0 0  Easily annoyed or irritable 0 0 2  Afraid - awful might happen 1 0 0  Total GAD 7 Score 3 3 5   Anxiety Difficulty - - Not difficult at all   No Known Allergies Social History   Tobacco Use   Smoking status: Never   Smokeless tobacco: Never  Substance Use Topics   Alcohol use: No   Past Medical History:  Diagnosis Date   Allergy    Anxiety    Arthritis    Depression    Hyperlipidemia    Hypertension    IFG (impaired fasting glucose)    A1c's all 5.9% or less   Vitamin D deficiency    Past Surgical History:  Procedure Laterality Date   TUBAL LIGATION     Family History  Problem Relation Age of Onset   Dementia Mother    Pneumonia Father    Rheum arthritis Paternal Uncle    Allergies as of 12/19/2020   No Known Allergies      Medication List        Accurate as of December 19, 2020 11:59 PM. If you have any questions, ask your nurse or doctor.  CENTRUM ULTRA WOMENS PO Take by mouth daily.   diclofenac Sodium 1 % Gel Commonly known as: VOLTAREN Apply topically 4 (four) times daily.   FISH OIL PO Take by mouth.   irbesartan 300 MG tablet Commonly known as: AVAPRO Take 1 tablet (300 mg total) by mouth at bedtime.   naproxen sodium 220 MG tablet Commonly known as: ALEVE Take 220 mg by mouth in the morning and at bedtime.   rosuvastatin 10 MG tablet Commonly known as: CRESTOR TAKE 1 TABLET BY MOUTH MONDAY THROUGH FRIDAY   triamcinolone 55 MCG/ACT Aero nasal inhaler Commonly known as: NASACORT Place 2 sprays into the nose as needed.   venlafaxine XR 75 MG 24 hr capsule Commonly known as: EFFEXOR-XR TAKE 1 CAPSULE BY MOUTH EVERY DAY   VITAMIN D-3 PO Take 2,000 Int'l Units by mouth daily.        All past medical history, surgical history, allergies, family history, immunizations andmedications were updated in the EMR today and reviewed under the history and medication portions of their EMR.      ROS: Negative, with the exception of above mentioned in HPI   Objective:  BP 131/78   Pulse 75   Temp 98.2 F (36.8 C) (Oral)   Resp 16   Ht 5\' 4"  (1.626 m)   Wt 241 lb 6.4 oz (109.5 kg)   SpO2 98%   BMI 41.44 kg/m  Body mass index is 41.44 kg/m. Gen: Afebrile. No acute distress.  Nontoxic, very pleasant obese female. HENT: AT. [place].  Eyes:Pupils Equal Round Reactive to light, Extraocular movements intact,  Conjunctiva without redness, discharge or icterus. Neck/lymp/endocrine: Supple, no lymphadenopathy, no thyromegaly CV: RRR no murmur, no edema, +2/4 P posterior tibialis pulses Chest: CTAB, no wheeze or crackles Abd: Soft.  Obese. NTND. BS present.  Skin: no rashes, purpura or petechiae.  Neuro: Normal gait. PERLA. EOMi. Alert. Oriented x3 Psych: Normal affect, dress and demeanor. Normal speech. Normal thought content and judgment.   No results found. No results found. No results found for this or any previous visit (from the past 24 hour(s)).  Assessment/Plan: Brandy Bowman is a 70 y.o. female present for OV for  Essential hypertension, benign/HLD/morbid obesity -Much improved and stable -Continue irbesartan 300 mg daily - Watch added salt and exercise > 150 minutes a week  -Continue crestor 10 mg QD - No ASA use --> takes daily naproxen - labs UTD- due next visit.  - F/U 5.5 months.     Depressive disorder Stable. Continue Effexor to 75 mg daily - f/u q 5.5  months.  Vit d def: - supplementing 3000u daily.    Reviewed expectations re: course of current medical issues. Discussed self-management of symptoms. Outlined signs and symptoms indicating need for more acute intervention. Patient verbalized understanding and all questions were answered. Patient received an After-Visit Summary.    Return in about 5 months (around 06/04/2021) for CMC (30 min).  No orders of the defined types were placed in this encounter.  Meds ordered this encounter   Medications   irbesartan (AVAPRO) 300 MG tablet    Sig: Take 1 tablet (300 mg total) by mouth at bedtime.    Dispense:  90 tablet    Refill:  1   rosuvastatin (CRESTOR) 10 MG tablet    Sig: TAKE 1 TABLET BY MOUTH MONDAY THROUGH FRIDAY    Dispense:  24 tablet    Refill:  6   venlafaxine XR (EFFEXOR-XR) 75 MG 24 hr capsule  Sig: TAKE 1 CAPSULE BY MOUTH EVERY DAY    Dispense:  90 capsule    Refill:  1      Note is dictated utilizing voice recognition software. Although note has been proof read prior to signing, occasional typographical errors still can be missed. If any questions arise, please do not hesitate to call for verification.   electronically signed by:  Felix Pacini, DO  [facility] - [place]

## 2020-12-19 NOTE — Patient Instructions (Addendum)
Great to see you today. You look great! I have refilled the medication(s) we provide.   If labs were collected, we will inform you of lab results once received either by echart message or telephone call.   - echart message- for normal results that have been seen by the patient already.   - telephone call: abnormal results or if patient has not viewed results in their echart.   Next appt end of February - labs will be collected that visit. Come fasting if you can.

## 2020-12-21 ENCOUNTER — Encounter: Payer: Self-pay | Admitting: Family Medicine

## 2021-01-31 ENCOUNTER — Telehealth: Payer: Self-pay | Admitting: Family Medicine

## 2021-01-31 NOTE — Telephone Encounter (Signed)
Left message for patient to schedule Annual Wellness Visit.  Please schedule with Nurse Health Advisor Julie Greer, RN at [place] [facility]. Please call [phone] ask for Kathy  

## 2021-03-12 ENCOUNTER — Other Ambulatory Visit: Payer: Self-pay | Admitting: Family Medicine

## 2021-03-12 DIAGNOSIS — I10 Essential (primary) hypertension: Secondary | ICD-10-CM

## 2021-03-12 DIAGNOSIS — F32A Depression, unspecified: Secondary | ICD-10-CM

## 2021-04-12 ENCOUNTER — Telehealth: Payer: Self-pay | Admitting: Family Medicine

## 2021-04-12 NOTE — Telephone Encounter (Signed)
Left message for patient to schedule Annual Wellness Visit.  Please schedule (telephone/video call) with Nurse Health Advisor Tina Betterson, RN at  [facility]. Please call [phone] ask for Kathy 

## 2021-05-21 ENCOUNTER — Ambulatory Visit (INDEPENDENT_AMBULATORY_CARE_PROVIDER_SITE_OTHER): Payer: Medicare Other | Admitting: Family Medicine

## 2021-05-21 ENCOUNTER — Other Ambulatory Visit: Payer: Self-pay

## 2021-05-21 ENCOUNTER — Encounter: Payer: Self-pay | Admitting: Family Medicine

## 2021-05-21 DIAGNOSIS — R809 Proteinuria, unspecified: Secondary | ICD-10-CM

## 2021-05-21 DIAGNOSIS — E7849 Other hyperlipidemia: Secondary | ICD-10-CM | POA: Diagnosis not present

## 2021-05-21 DIAGNOSIS — E559 Vitamin D deficiency, unspecified: Secondary | ICD-10-CM

## 2021-05-21 DIAGNOSIS — F32A Depression, unspecified: Secondary | ICD-10-CM | POA: Diagnosis not present

## 2021-05-21 DIAGNOSIS — I10 Essential (primary) hypertension: Secondary | ICD-10-CM | POA: Diagnosis not present

## 2021-05-21 LAB — CBC
HCT: 38.9 % (ref 36.0–46.0)
Hemoglobin: 12.6 g/dL (ref 12.0–15.0)
MCHC: 32.5 g/dL (ref 30.0–36.0)
MCV: 90.2 fl (ref 78.0–100.0)
Platelets: 306 10*3/uL (ref 150.0–400.0)
RBC: 4.31 Mil/uL (ref 3.87–5.11)
RDW: 13.8 % (ref 11.5–15.5)
WBC: 7.4 10*3/uL (ref 4.0–10.5)

## 2021-05-21 LAB — VITAMIN D 25 HYDROXY (VIT D DEFICIENCY, FRACTURES): VITD: 31.03 ng/mL (ref 30.00–100.00)

## 2021-05-21 LAB — TSH: TSH: 2.86 u[IU]/mL (ref 0.35–5.50)

## 2021-05-21 MED ORDER — ROSUVASTATIN CALCIUM 10 MG PO TABS: ORAL_TABLET | ORAL | 6 refills | Status: DC

## 2021-05-21 MED ORDER — IRBESARTAN 300 MG PO TABS: 300.0000 mg | ORAL_TABLET | Freq: Every day | ORAL | 1 refills | Status: DC

## 2021-05-21 MED ORDER — VENLAFAXINE HCL ER 75 MG PO CP24: ORAL_CAPSULE | ORAL | 1 refills | Status: DC

## 2021-05-21 NOTE — Progress Notes (Signed)
Brandy Bowman , 1950-04-30, 71 y.o., female MRN: [id number] Patient Care Team    Relationship Specialty Notifications Start End  Ma Hillock, DO PCP - General Family Medicine  03/08/16     Chief Complaint  Patient presents with   Hypertension    [facility]; pt is fasting     Subjective: Brandy Bowman is a 71 y.o. present for [facility] followup Essential hypertension, benign//hyperlipidemia/morbid obesity:  Pt reports compliance with Crestor 10 mg, fish oil supplement and irbesartan 300 mg. Blood pressures ranges at home are not routinely checked since her husband passed away. Patient denies chest pain, shortness of breath, dizziness or lower extremity edema.  Pt is not on  ASA 2/2 daily naproxen use. Pt is prescribed statin. Diet: Low sodium,  Very little meat consumption  Exercise: Not exercising routinely.  RF: HTN, HLD, No Fhx    Depressive disorder:  Patient reports she is feeling very well with Effexor 75 mg daily until her husband passed away in 2020/06/09. Her daughter has moved in with her and they are adjusting to living together and she feels that it is a good situation.   Her daughter Amy is supportive for her and is now her caregiver.    Depression screen [facility] 2/9 05/21/2021 12/19/2020 06/20/2020 02/16/2020 11/01/2019  Decreased Interest 0 0 0 0 0  Down, Depressed, Hopeless 0 1 0 0 3  PHQ - 2 Score 0 1 0 0 3  Altered sleeping 0 0 0 - 0  Tired, decreased energy 1 1 1  - 1  Change in appetite 0 0 0 - 0  Feeling bad or failure about yourself  0 0 0 - 0  Trouble concentrating 0 0 0 - 0  Moving slowly or fidgety/restless 0 0 0 - 0  Suicidal thoughts 0 0 0 - 0  PHQ-9 Score 1 2 1  - 4  Difficult doing work/chores - - - - Not difficult at all  Some recent data might be hidden   GAD 7 : Generalized Anxiety Score 05/21/2021 12/19/2020 06/20/2020 04/16/2018  Nervous, Anxious, on Edge 0 1 1 1   Control/stop worrying 1 0 1 1  Worry too much - different things 1 1 1 1   Trouble  relaxing 0 0 0 0  Restless 0 0 0 0  Easily annoyed or irritable 0 0 0 2  Afraid - awful might happen 0 1 0 0  Total GAD 7 Score 2 3 3 5   Anxiety Difficulty - - - Not difficult at all   No Known Allergies Social History   Tobacco Use   Smoking status: Never   Smokeless tobacco: Never  Substance Use Topics   Alcohol use: No   Past Medical History:  Diagnosis Date   Allergy    Anxiety    Arthritis    Depression    Hyperlipidemia    Hypertension    IFG (impaired fasting glucose)    A1c's all 5.9% or less   Vitamin D deficiency    Past Surgical History:  Procedure Laterality Date   TUBAL LIGATION     Family History  Problem Relation Age of Onset   Dementia Mother    Pneumonia Father    Rheum arthritis Paternal Uncle    Allergies as of 05/21/2021   No Known Allergies      Medication List        Accurate as of May 21, 2021  8:27 AM. If you have any questions, ask your nurse  or doctor.          CENTRUM ULTRA WOMENS PO Take by mouth daily.   diclofenac Sodium 1 % Gel Commonly known as: VOLTAREN Apply topically 4 (four) times daily.   FISH OIL PO Take by mouth.   irbesartan 300 MG tablet Commonly known as: AVAPRO Take 1 tablet (300 mg total) by mouth at bedtime.   naproxen sodium 220 MG tablet Commonly known as: ALEVE Take 220 mg by mouth in the morning and at bedtime.   rosuvastatin 10 MG tablet Commonly known as: CRESTOR TAKE 1 TABLET BY MOUTH MONDAY THROUGH FRIDAY   triamcinolone 55 MCG/ACT Aero nasal inhaler Commonly known as: NASACORT Place 2 sprays into the nose as needed.   venlafaxine XR 75 MG 24 hr capsule Commonly known as: EFFEXOR-XR TAKE 1 CAPSULE BY MOUTH EVERY DAY   VITAMIN D-3 PO Take 2,000 Int'l Units by mouth daily.        All past medical history, surgical history, allergies, family history, immunizations andmedications were updated in the EMR today and reviewed under the history and medication portions of their  EMR.     ROS: Negative, with the exception of above mentioned in HPI   Objective:  BP 134/78    Pulse 84    Temp 98.2 F (36.8 C) (Oral)    Ht 5' 4"  (1.626 m)    Wt 251 lb (113.9 kg)    SpO2 97%    BMI 43.08 kg/m  Body mass index is 43.08 kg/m. Physical Exam Vitals and nursing note reviewed.  Constitutional:      General: She is not in acute distress.    Appearance: Normal appearance. She is obese. She is not ill-appearing, toxic-appearing or diaphoretic.  HENT:     Head: Normocephalic and atraumatic.     Mouth/Throat:     Mouth: Mucous membranes are moist.  Eyes:     General: No scleral icterus.       Right eye: No discharge.        Left eye: No discharge.     Extraocular Movements: Extraocular movements intact.     Conjunctiva/sclera: Conjunctivae normal.     Pupils: Pupils are equal, round, and reactive to light.  Cardiovascular:     Rate and Rhythm: Normal rate and regular rhythm.  Pulmonary:     Effort: Pulmonary effort is normal. No respiratory distress.     Breath sounds: Normal breath sounds. No wheezing, rhonchi or rales.  Musculoskeletal:     Cervical back: Neck supple. No tenderness.  Lymphadenopathy:     Cervical: No cervical adenopathy.  Skin:    General: Skin is warm and dry.     Coloration: Skin is not jaundiced or pale.     Findings: No erythema or rash.  Neurological:     Mental Status: She is alert and oriented to person, place, and time. Mental status is at baseline.     Motor: No weakness.     Gait: Gait normal.  Psychiatric:        Mood and Affect: Mood normal.        Behavior: Behavior normal.        Thought Content: Thought content normal.        Judgment: Judgment normal.     No results found. No results found. No results found for this or any previous visit (from the past 24 hour(s)).  Assessment/Plan: Brandy Bowman is a 71 y.o. female present for OV for  Essential hypertension, benign/HLD/morbid  obesity -stable.  - continue   irbesartan 300 mg daily - Watch added salt and exercise > 150 minutes a week  - continue crestor 10 mg QD - No ASA use --> takes daily naproxen - Labs: cbc, cmp, tsh, lipids - F/U 5.5 months.     Depressive disorder Stable.  Continue Effexor to 75 mg daily - f/u q 5.5  months.  Vit d def: - supplementing 3000u daily. Vit d collected today    Reviewed expectations re: course of current medical issues. Discussed self-management of symptoms. Outlined signs and symptoms indicating need for more acute intervention. Patient verbalized understanding and all questions were answered. Patient received an After-Visit Summary.    Return in about 24 weeks (around 11/05/2021).  Orders Placed This Encounter  Procedures   CBC   Comp Met (CMET)   TSH   Lipid panel   Vitamin D (25 hydroxy)    Meds ordered this encounter  Medications   irbesartan (AVAPRO) 300 MG tablet    Sig: Take 1 tablet (300 mg total) by mouth at bedtime.    Dispense:  90 tablet    Refill:  1   rosuvastatin (CRESTOR) 10 MG tablet    Sig: TAKE 1 TABLET BY MOUTH MONDAY THROUGH FRIDAY    Dispense:  24 tablet    Refill:  6   venlafaxine XR (EFFEXOR-XR) 75 MG 24 hr capsule    Sig: TAKE 1 CAPSULE BY MOUTH EVERY DAY    Dispense:  90 capsule    Refill:  1      Note is dictated utilizing voice recognition software. Although note has been proof read prior to signing, occasional typographical errors still can be missed. If any questions arise, please do not hesitate to call for verification.   electronically signed by:  Howard Pouch, DO  [place]

## 2021-05-21 NOTE — Patient Instructions (Signed)
Great to see you today.  I have refilled the medication(s) we provide.   If labs were collected, we will inform you of lab results once received either by echart message or telephone call.   - echart message- for normal results that have been seen by the patient already.   - telephone call: abnormal results or if patient has not viewed results in their echart.  

## 2021-05-22 LAB — COMPREHENSIVE METABOLIC PANEL
ALT: 11 U/L (ref 0–35)
AST: 15 U/L (ref 0–37)
Albumin: 4.1 g/dL (ref 3.5–5.2)
Alkaline Phosphatase: 83 U/L (ref 39–117)
BUN: 19 mg/dL (ref 6–23)
CO2: 28 mEq/L (ref 19–32)
Calcium: 9.1 mg/dL (ref 8.4–10.5)
Chloride: 105 mEq/L (ref 96–112)
Creatinine, Ser: 0.58 mg/dL (ref 0.40–1.20)
GFR: 91.2 mL/min (ref >60.00)
Glucose, Bld: 97 mg/dL (ref 70–99)
Potassium: 4.1 mEq/L (ref 3.5–5.1)
Sodium: 141 mEq/L (ref 135–145)
Total Bilirubin: 0.5 mg/dL (ref 0.2–1.2)
Total Protein: 6.7 g/dL (ref 6.0–8.3)

## 2021-05-22 LAB — LIPID PANEL
Cholesterol: 177 mg/dL (ref 0–200)
HDL: 51.5 mg/dL (ref >39.00)
LDL Cholesterol: 94 mg/dL (ref 0–99)
NonHDL: 125.39
Total CHOL/HDL Ratio: 3
Triglycerides: 157 mg/dL — ABNORMAL HIGH (ref 0.0–149.0)
VLDL: 31.4 mg/dL (ref 0.0–40.0)

## 2021-07-05 ENCOUNTER — Ambulatory Visit (INDEPENDENT_AMBULATORY_CARE_PROVIDER_SITE_OTHER): Payer: Medicare Other

## 2021-07-05 DIAGNOSIS — Z1211 Encounter for screening for malignant neoplasm of colon: Secondary | ICD-10-CM

## 2021-07-05 DIAGNOSIS — Z0000 Encounter for general adult medical examination without abnormal findings: Secondary | ICD-10-CM | POA: Diagnosis not present

## 2021-07-05 MED ORDER — TETANUS-DIPHTH-ACELL PERTUSSIS 5-2.5-18.5 LF-MCG/0.5 IM SUSP: 0.5000 mL | Freq: Once | INTRAMUSCULAR | 0 refills | Status: AC

## 2021-07-05 NOTE — Patient Instructions (Signed)
?  Brandy Bowman , ?Thank you for taking time to come for your Medicare Wellness Visit. I appreciate your ongoing commitment to your health goals. Please review the following plan we discussed and let me know if I can assist you in the future.  ? ?These are the goals we discussed: ? Goals   ? ?   Healthy Nutrition Achieved (pt-stated)   ?   Evidence-based guidance:  ?Assess patient perspective on healthy weight, weight loss or weight gain, motivation and readiness for change.  ?Recommend or set healthy weight goal based on body mass index.  ?Review current dietary intake and exercise levels.  ?Encourage small steps toward making change to eating and exercising.  ?Provide individualized medical nutrition therapy.   ?Notes:  ?  ?   Patient Stated   ?   Would like to lose weight ?  ? ?  ?  ?This is a list of the screening recommended for you and due dates:  ?Health Maintenance  ?Topic Date Due  ? Colon Cancer Screening  04/08/2021  ? Tetanus Vaccine  04/08/2021  ? Zoster (Shingles) Vaccine (1 of 2) 10/05/2021*  ? Mammogram  07/06/2022*  ? DEXA scan (bone density measurement)  07/06/2022*  ? Pneumonia Vaccine  Completed  ? Flu Shot  Completed  ? COVID-19 Vaccine  Completed  ? Hepatitis C Screening: USPSTF Recommendation to screen - Ages 28-79 yo.  Completed  ? HPV Vaccine  Aged Out  ?*Topic was postponed. The date shown is not the original due date.  ?  ?

## 2021-07-05 NOTE — Progress Notes (Signed)
? ?Subjective:  ? Brandy Bowman is a 71 y.o. female who presents for Medicare Annual (Subsequent) preventive examination. I connected with  Brandy Bowman on 07/05/21 by a audio enabled telemedicine application and verified that I am speaking with the correct person using two identifiers. ? ?Patient Location: Home ? ?Provider Location: Office/Clinic ? ?I discussed the limitations of evaluation and management by telemedicine. The patient expressed understanding and agreed to proceed.  ? ?Review of Systems    ?Defer to PCP ?Cardiac Risk Factors include: advanced age (>69men, >61 women);hypertension;obesity (BMI >30kg/m2);sedentary lifestyle ? ?   ?Objective:  ?  ?There were no vitals filed for this visit. ?There is no height or weight on file to calculate BMI. ? ? ?  07/05/2021  ?  2:24 PM 02/16/2020  ?  3:05 PM 03/11/2017  ?  9:22 AM 03/08/2016  ?  8:44 AM  ?Advanced Directives  ?Does Patient Have a Medical Advance Directive? Yes Yes No No  ?Type of Paramedic of [place];Living will [place];Living will    ?Does patient want to make changes to medical advance directive? No - Patient declined     ?Copy of [place] in Chart? No - copy requested No - copy requested    ?Would patient like information on creating a medical advance directive?   No - Patient declined Yes (MAU/Ambulatory/Procedural Areas - Information given)  ? ? ?Current Medications (verified) ?Outpatient Encounter Medications as of 07/05/2021  ?Medication Sig  ? Tdap (BOOSTRIX) 5-2.5-18.5 LF-MCG/0.5 injection Inject 0.5 mLs into the muscle once for 1 dose.  ? Cholecalciferol (VITAMIN D-3 PO) Take 2,000 Int'l Units by mouth daily.  ? diclofenac Sodium (VOLTAREN) 1 % GEL Apply topically 4 (four) times daily.  ? irbesartan (AVAPRO) 300 MG tablet Take 1 tablet (300 mg total) by mouth at bedtime.  ? Multiple Vitamins-Minerals (CENTRUM ULTRA WOMENS PO) Take by mouth daily.  ? naproxen sodium  (ALEVE) 220 MG tablet Take 220 mg by mouth in the morning and at bedtime.  ? Omega-3 Fatty Acids (FISH OIL PO) Take by mouth.  ? rosuvastatin (CRESTOR) 10 MG tablet TAKE 1 TABLET BY MOUTH MONDAY THROUGH FRIDAY  ? triamcinolone (NASACORT) 55 MCG/ACT AERO nasal inhaler Place 2 sprays into the nose as needed.  ? venlafaxine XR (EFFEXOR-XR) 75 MG 24 hr capsule TAKE 1 CAPSULE BY MOUTH EVERY DAY  ? ?No facility-administered encounter medications on file as of 07/05/2021.  ? ? ?Allergies (verified) ?Patient has no known allergies.  ? ?History: ?Past Medical History:  ?Diagnosis Date  ? Allergy   ? Anxiety   ? Arthritis   ? Depression   ? Hyperlipidemia   ? Hypertension   ? IFG (impaired fasting glucose)   ? A1c's all 5.9% or less  ? Vitamin D deficiency   ? ?Past Surgical History:  ?Procedure Laterality Date  ? TUBAL LIGATION    ? ?Family History  ?Problem Relation Age of Onset  ? Dementia Mother   ? Pneumonia Father   ? Rheum arthritis Paternal Uncle   ? ?Social History  ? ?Socioeconomic History  ? Marital status: Widowed  ?  Spouse name: Not on file  ? Number of children: 2  ? Years of education: Not on file  ? Highest education level: Not on file  ?Occupational History  ? Occupation: retired Engineer, site  ?Tobacco Use  ? Smoking status: Never  ? Smokeless tobacco: Never  ?Vaping Use  ? Vaping Use: Never  used  ?Substance and Sexual Activity  ? Alcohol use: No  ? Drug use: No  ? Sexual activity: Never  ?Other Topics Concern  ? Not on file  ?Social History Narrative  ? ** Merged History Encounter **  ?    ? 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 Currently lives with husband who is several years older and is independent, but has some forgetfulness. Daughter lives in [place]. Does not have contact with son. Enjoys yard work.   ? ?Social Determinants of Health  ? ?Financial Resource Strain: Low Risk   ? Difficulty of Paying Living Expenses: Not hard at all  ?Food Insecurity: No Food Insecurity   ? Worried About Charity fundraiser in the Last Year: Never true  ? Ran Out of Food in the Last Year: Never true  ?Transportation Needs: No Transportation Needs  ? Lack of Transportation (Medical): No  ? Lack of Transportation (Non-Medical): No  ?Physical Activity: Inactive  ? Days of Exercise per Week: 0 days  ? Minutes of Exercise per Session: 0 min  ?Stress: No Stress Concern Present  ? Feeling of Stress : Not at all  ?Social Connections: Socially Isolated  ? Frequency of Communication with Friends and Family: Once a week  ? Frequency of Social Gatherings with Friends and Family: Never  ? Attends Religious Services: Never  ? Active Member of Clubs or Organizations: No  ? Attends Archivist Meetings: Never  ? Marital Status: Married  ? ? ?Tobacco Counseling ?Counseling given: Not Answered ? ? ?Clinical Intake: ? ?Pre-visit preparation completed: Yes ? ?Pain : No/denies pain ? ?  ? ?Nutritional Status: BMI > 30  Obese ?Nutritional Risks: None ?Diabetes: No ? ?How often do you need to have someone help you when you read instructions, pamphlets, or other written materials from your doctor or pharmacy?: 1 - Never ?What is the last grade level you completed in school?: Master's degree ? ?Diabetic?No ? ?Interpreter Needed?: No ? ?Information entered by :: Toria C., RMA ? ? ?Activities of Daily Living ? ?  07/05/2021  ?  3:32 PM 07/05/2021  ?  2:27 PM  ?In your present state of health, do you have any difficulty performing the following activities:  ?Hearing? 0 0  ?Vision? 0 0  ?Difficulty concentrating or making decisions? 0 0  ?Walking or climbing stairs? 1 0  ?Dressing or bathing? 0 0  ?Doing errands, shopping? 0 0  ?Preparing Food and eating ? N N  ?Using the Toilet? N N  ?In the past six months, have you accidently leaked urine? N N  ?Do you have problems with loss of bowel control? N N  ?Managing your Medications? N N  ?Managing your Finances? N N  ?Housekeeping or managing your Housekeeping? N N   ? ? ?Patient Care Team: ?Ma Hillock, DO as PCP - General (Family Medicine) ? ?Indicate any recent Medical Services you may have received from other than [facility] providers in the past year (date may be approximate). ? ?   ?Assessment:  ? This is a routine wellness examination for Brandy Bowman. ? ?Hearing/Vision screen ?No results found. ? ?Dietary issues and exercise activities discussed: ?Current Exercise Habits: Home exercise routine;The patient does not participate in regular exercise at present, Exercise limited by: orthopedic condition(s) ? ? Goals Addressed   ? ?  ?  ?  ?  ?  ? This Visit's Progress  ?   Healthy Nutrition Achieved (  pt-stated)     ?   Evidence-based guidance:  ?Assess patient perspective on healthy weight, weight loss or weight gain, motivation and readiness for change.  ?Recommend or set healthy weight goal based on body mass index.  ?Review current dietary intake and exercise levels.  ?Encourage small steps toward making change to eating and exercising.  ?Provide individualized medical nutrition therapy.   ?Notes:  ?  ? ?  ?Depression Screen ? ?  07/05/2021  ?  2:19 PM 05/21/2021  ?  8:11 AM 12/19/2020  ?  9:23 AM 06/20/2020  ? 11:05 AM 02/16/2020  ?  3:06 PM 11/01/2019  ?  2:00 PM 04/13/2019  ? 10:00 AM  ?PHQ 2/9 Scores  ?PHQ - 2 Score 0 0 1 0 0 3 0  ?PHQ- 9 Score  1 2 1  4 1   ?  ?Fall Risk ? ?  07/05/2021  ?  3:34 PM 05/21/2021  ?  8:05 AM 06/20/2020  ? 10:50 AM 02/16/2020  ?  3:05 PM 04/16/2018  ?  8:52 AM  ?Fall Risk   ?Falls in the past year? 0 0 1 0 0  ?Number falls in past yr: 0 0 0 0 0  ?Injury with Fall? 0 0 0 0 0  ?Risk for fall due to : History of fall(s) No Fall Risks     ?Follow up Falls evaluation completed Falls evaluation completed Education provided    ? ? ?FALL RISK PREVENTION PERTAINING TO THE HOME: ? ?Any stairs in or around the home? Yes  ?If so, are there any without handrails? No  ?Home free of loose throw rugs in walkways, pet beds, electrical cords, etc? Yes  ?Adequate lighting in your  home to reduce risk of falls? Yes  ? ?ASSISTIVE DEVICES UTILIZED TO PREVENT FALLS: ? ?Life alert? No  ?Use of a cane, walker or w/c? No  ?Grab bars in the bathroom? Yes  ?Shower chair or bench in shower? No ?Ele

## 2021-07-20 DIAGNOSIS — Z1211 Encounter for screening for malignant neoplasm of colon: Secondary | ICD-10-CM | POA: Diagnosis not present

## 2021-07-28 LAB — COLOGUARD: COLOGUARD: NEGATIVE

## 2021-07-30 DIAGNOSIS — H25813 Combined forms of age-related cataract, bilateral: Secondary | ICD-10-CM | POA: Diagnosis not present

## 2021-07-30 DIAGNOSIS — H1045 Other chronic allergic conjunctivitis: Secondary | ICD-10-CM | POA: Diagnosis not present

## 2021-07-30 DIAGNOSIS — H5213 Myopia, bilateral: Secondary | ICD-10-CM | POA: Diagnosis not present

## 2021-10-26 ENCOUNTER — Other Ambulatory Visit: Payer: Self-pay | Admitting: Family Medicine

## 2021-10-30 ENCOUNTER — Other Ambulatory Visit: Payer: Self-pay

## 2021-10-30 MED ORDER — VENLAFAXINE HCL ER 75 MG PO CP24: ORAL_CAPSULE | ORAL | 0 refills | Status: DC

## 2021-10-30 MED ORDER — IRBESARTAN 300 MG PO TABS: 300.0000 mg | ORAL_TABLET | Freq: Every day | ORAL | 0 refills | Status: DC

## 2021-11-06 ENCOUNTER — Ambulatory Visit (INDEPENDENT_AMBULATORY_CARE_PROVIDER_SITE_OTHER): Payer: Medicare Other | Admitting: Family Medicine

## 2021-11-06 ENCOUNTER — Encounter: Payer: Self-pay | Admitting: Family Medicine

## 2021-11-06 VITALS — BP 128/73 | HR 80 | Temp 98.1°F | Ht 64.0 in | Wt 257.0 lb

## 2021-11-06 DIAGNOSIS — I10 Essential (primary) hypertension: Secondary | ICD-10-CM

## 2021-11-06 DIAGNOSIS — E782 Mixed hyperlipidemia: Secondary | ICD-10-CM

## 2021-11-06 DIAGNOSIS — E559 Vitamin D deficiency, unspecified: Secondary | ICD-10-CM | POA: Diagnosis not present

## 2021-11-06 MED ORDER — VENLAFAXINE HCL ER 75 MG PO CP24: ORAL_CAPSULE | ORAL | 1 refills | Status: DC

## 2021-11-06 MED ORDER — IRBESARTAN 300 MG PO TABS: 300.0000 mg | ORAL_TABLET | Freq: Every day | ORAL | 1 refills | Status: DC

## 2021-11-06 MED ORDER — ROSUVASTATIN CALCIUM 10 MG PO TABS: ORAL_TABLET | ORAL | 3 refills | Status: DC

## 2021-11-06 NOTE — Progress Notes (Signed)
Brandy Bowman , 05/04/1950, 71 y.o., female MRN: [id number] Patient Care Team    Relationship Specialty Notifications Start End  Natalia Leatherwood, DO PCP - General Family Medicine  03/08/16     Chief Complaint  Patient presents with   Hypertension    [facility]; pt is fasting     Subjective: Brandy Bowman is a 71 y.o. present for [facility] followup Essential hypertension, benign//hyperlipidemia/morbid obesity:  Pt reports compliance with Crestor 10 mg, fish oil supplement and irbesartan 300 mg. Blood pressures ranges at home are not routinely checked since her husband passed away. Patient denies chest pain, shortness of breath, dizziness or lower extremity edema.  Pt is not on  ASA 2/2 daily naproxen use. Pt is prescribed statin. Diet: Low sodium,  Very little meat consumption  Exercise: Not exercising routinely.  RF: HTN, HLD, No Fhx    Depressive disorder:  Patient reports she is feeling good on  Effexor 75 mg daily. husband passed away in March 10, 2022and her daughter moved in with her and they are adjusting well and supporting each other.       11/06/2021    8:09 AM 07/05/2021    2:19 PM 05/21/2021    8:11 AM 12/19/2020    9:23 AM 06/20/2020   11:05 AM  Depression screen PHQ 2/9  Decreased Interest 0 0 0 0 0  Down, Depressed, Hopeless 0 0 0 1 0  PHQ - 2 Score 0 0 0 1 0  Altered sleeping 0  0 0 0  Tired, decreased energy 0  1 1 1   Change in appetite 0  0 0 0  Feeling bad or failure about yourself  0  0 0 0  Trouble concentrating 0  0 0 0  Moving slowly or fidgety/restless 0  0 0 0  Suicidal thoughts 0  0 0 0  PHQ-9 Score 0  1 2 1       11/06/2021    8:09 AM 05/21/2021    8:12 AM 12/19/2020    9:24 AM 06/20/2020   11:05 AM  GAD 7 : Generalized Anxiety Score  Nervous, Anxious, on Edge 0 0 1 1  Control/stop worrying 0 1 0 1  Worry too much - different things 1 1 1 1   Trouble relaxing 0 0 0 0  Restless 0 0 0 0  Easily annoyed or irritable 0 0 0 0  Afraid - awful might happen  0 0 1 0  Total GAD 7 Score 1 2 3 3    No Known Allergies Social History   Tobacco Use   Smoking status: Never   Smokeless tobacco: Never  Substance Use Topics   Alcohol use: No   Past Medical History:  Diagnosis Date   Allergy    Anxiety    Arthritis    Depression    Hyperlipidemia    Hypertension    IFG (impaired fasting glucose)    A1c's all 5.9% or less   Vitamin D deficiency    Past Surgical History:  Procedure Laterality Date   TUBAL LIGATION     Family History  Problem Relation Age of Onset   Dementia Mother    Pneumonia Father    Rheum arthritis Paternal Uncle    Allergies as of 11/06/2021   No Known Allergies      Medication List        Accurate as of November 06, 2021  8:12 AM. If you have any questions, ask your nurse or  doctor.          CENTRUM ULTRA WOMENS PO Take by mouth daily.   diclofenac Sodium 1 % Gel Commonly known as: VOLTAREN Apply topically 4 (four) times daily.   FISH OIL PO Take by mouth.   irbesartan 300 MG tablet Commonly known as: AVAPRO Take 1 tablet (300 mg total) by mouth at bedtime.   naproxen sodium 220 MG tablet Commonly known as: ALEVE Take 220 mg by mouth in the morning and at bedtime.   rosuvastatin 10 MG tablet Commonly known as: CRESTOR TAKE 1 TABLET BY MOUTH MONDAY THROUGH FRIDAY   triamcinolone 55 MCG/ACT Aero nasal inhaler Commonly known as: NASACORT Place 2 sprays into the nose as needed.   venlafaxine XR 75 MG 24 hr capsule Commonly known as: EFFEXOR-XR TAKE 1 CAPSULE BY MOUTH EVERY DAY   VITAMIN D-3 PO Take 2,000 Int'l Units by mouth daily.        All past medical history, surgical history, allergies, family history, immunizations andmedications were updated in the EMR today and reviewed under the history and medication portions of their EMR.     ROS: Negative, with the exception of above mentioned in HPI   Objective:  BP 128/73   Pulse 80   Temp 98.1 F (36.7 C) (Oral)   Ht 5\' 4"   (1.626 m)   Wt 257 lb (116.6 kg)   SpO2 98%   BMI 44.11 kg/m  Body mass index is 44.11 kg/m. Physical Exam Vitals and nursing note reviewed.  Constitutional:      General: She is not in acute distress.    Appearance: Normal appearance. She is obese. She is not ill-appearing, toxic-appearing or diaphoretic.  HENT:     Head: Normocephalic and atraumatic.  Eyes:     General: No scleral icterus.       Right eye: No discharge.        Left eye: No discharge.     Extraocular Movements: Extraocular movements intact.     Conjunctiva/sclera: Conjunctivae normal.     Pupils: Pupils are equal, round, and reactive to light.  Cardiovascular:     Rate and Rhythm: Normal rate and regular rhythm.     Heart sounds: No murmur heard. Pulmonary:     Effort: Pulmonary effort is normal. No respiratory distress.     Breath sounds: Normal breath sounds. No wheezing, rhonchi or rales.  Musculoskeletal:     Cervical back: Neck supple. No tenderness.     Right lower leg: No edema.     Left lower leg: No edema.  Lymphadenopathy:     Cervical: No cervical adenopathy.  Skin:    General: Skin is warm and dry.     Coloration: Skin is not jaundiced or pale.     Findings: No erythema or rash.  Neurological:     Mental Status: She is alert and oriented to person, place, and time. Mental status is at baseline.     Motor: No weakness.     Gait: Gait normal.  Psychiatric:        Mood and Affect: Mood normal.        Behavior: Behavior normal.        Thought Content: Thought content normal.        Judgment: Judgment normal.      No results found. No results found. No results found for this or any previous visit (from the past 24 hour(s)).  Assessment/Plan: [name] is a 71 y.o. female present for  OV for  Essential hypertension, benign/HLD/morbid obesity Stable Continue irbesartan 300 mg daily - Watch added salt and exercise > 150 minutes a week  Continue crestor 10 mg QD - No ASA use -->  takes daily naproxen - Labs: Due next visit   Depressive disorder Stable Continue Effexor to 75 mg daily  Vit d def: - supplementing 3000u daily. Vitamin D up-to-date    Reviewed expectations re: course of current medical issues. Discussed self-management of symptoms. Outlined signs and symptoms indicating need for more acute intervention. Patient verbalized understanding and all questions were answered. Patient received an After-Visit Summary.    No follow-ups on file.  No orders of the defined types were placed in this encounter.   No orders of the defined types were placed in this encounter.     Note is dictated utilizing voice recognition software. Although note has been proof read prior to signing, occasional typographical errors still can be missed. If any questions arise, please do not hesitate to call for verification.   electronically signed by:  Felix Pacini, DO  [facility] - [place]

## 2021-11-06 NOTE — Patient Instructions (Addendum)
Return in about 24 weeks (around 04/23/2022) for Routine chronic condition follow-up.        Great to see you today.  I have refilled the medication(s) we provide.   If labs were collected, we will inform you of lab results once received either by echart message or telephone call.   - echart message- for normal results that have been seen by the patient already.   - telephone call: abnormal results or if patient has not viewed results in their echart.

## 2022-02-12 ENCOUNTER — Other Ambulatory Visit: Payer: Self-pay

## 2022-04-23 ENCOUNTER — Ambulatory Visit: Payer: Medicare Other | Admitting: Family Medicine

## 2022-04-24 ENCOUNTER — Ambulatory Visit (INDEPENDENT_AMBULATORY_CARE_PROVIDER_SITE_OTHER): Payer: Medicare Other | Admitting: Family Medicine

## 2022-04-24 ENCOUNTER — Encounter: Payer: Self-pay | Admitting: Family Medicine

## 2022-04-24 VITALS — BP 133/83 | HR 82 | Temp 98.3°F | Ht 64.0 in | Wt 256.0 lb

## 2022-04-24 DIAGNOSIS — I10 Essential (primary) hypertension: Secondary | ICD-10-CM | POA: Diagnosis not present

## 2022-04-24 DIAGNOSIS — E782 Mixed hyperlipidemia: Secondary | ICD-10-CM | POA: Diagnosis not present

## 2022-04-24 DIAGNOSIS — E559 Vitamin D deficiency, unspecified: Secondary | ICD-10-CM

## 2022-04-24 DIAGNOSIS — Z79899 Other long term (current) drug therapy: Secondary | ICD-10-CM | POA: Diagnosis not present

## 2022-04-24 LAB — CBC
HCT: 40.2 % (ref 36.0–46.0)
Hemoglobin: 13.5 g/dL (ref 12.0–15.0)
MCHC: 33.5 g/dL (ref 30.0–36.0)
MCV: 90.3 fl (ref 78.0–100.0)
Platelets: 291 10*3/uL (ref 150.0–400.0)
RBC: 4.45 Mil/uL (ref 3.87–5.11)
RDW: 13.3 % (ref 11.5–15.5)
WBC: 7.5 10*3/uL (ref 4.0–10.5)

## 2022-04-24 LAB — VITAMIN D 25 HYDROXY (VIT D DEFICIENCY, FRACTURES): VITD: 25.56 ng/mL — ABNORMAL LOW (ref 30.00–100.00)

## 2022-04-24 LAB — COMPREHENSIVE METABOLIC PANEL
ALT: 14 U/L (ref 0–35)
AST: 14 U/L (ref 0–37)
Albumin: 4.2 g/dL (ref 3.5–5.2)
Alkaline Phosphatase: 77 U/L (ref 39–117)
BUN: 20 mg/dL (ref 6–23)
CO2: 28 mEq/L (ref 19–32)
Calcium: 9.1 mg/dL (ref 8.4–10.5)
Chloride: 105 mEq/L (ref 96–112)
Creatinine, Ser: 0.56 mg/dL (ref 0.40–1.20)
GFR: 91.38 mL/min (ref >60.00)
Glucose, Bld: 105 mg/dL — ABNORMAL HIGH (ref 70–99)
Potassium: 4.2 mEq/L (ref 3.5–5.1)
Sodium: 141 mEq/L (ref 135–145)
Total Bilirubin: 0.5 mg/dL (ref 0.2–1.2)
Total Protein: 6.9 g/dL (ref 6.0–8.3)

## 2022-04-24 LAB — LIPID PANEL
Cholesterol: 168 mg/dL (ref 0–200)
HDL: 47.6 mg/dL (ref >39.00)
LDL Cholesterol: 93 mg/dL (ref 0–99)
NonHDL: 120.54
Total CHOL/HDL Ratio: 4
Triglycerides: 136 mg/dL (ref 0.0–149.0)
VLDL: 27.2 mg/dL (ref 0.0–40.0)

## 2022-04-24 LAB — HEMOGLOBIN A1C: Hgb A1c MFr Bld: 6 % (ref 4.6–6.5)

## 2022-04-24 LAB — TSH: TSH: 2.28 u[IU]/mL (ref 0.35–5.50)

## 2022-04-24 MED ORDER — VENLAFAXINE HCL ER 75 MG PO CP24: ORAL_CAPSULE | ORAL | 1 refills | Status: DC

## 2022-04-24 MED ORDER — TETANUS-DIPHTH-ACELL PERTUSSIS 5-2.5-18.5 LF-MCG/0.5 IM SUSP: 0.5000 mL | Freq: Once | INTRAMUSCULAR | 0 refills | Status: AC

## 2022-04-24 MED ORDER — IRBESARTAN 300 MG PO TABS
300.0000 mg | ORAL_TABLET | Freq: Every day | ORAL | 1 refills | Status: DC
Start: 2022-04-24 — End: 2022-10-07

## 2022-04-24 NOTE — Progress Notes (Signed)
Brandy Bowman , Jul 01, 1950, 72 y.o., female MRN: [id number] Patient Care Team    Relationship Specialty Notifications Start End  Ma Hillock, DO PCP - General Family Medicine  03/08/16     Chief Complaint  Patient presents with   Hypertension    [facility]; pt is fasting;      Subjective: Brandy Bowman is a 72 y.o. present for [facility] followup Essential hypertension, benign//hyperlipidemia/morbid obesity:  Pt reports compliance with Crestor 10 mg, fish oil supplement and irbesartan 300 mg. Patient denies chest pain, shortness of breath, dizziness or lower extremity edema.   Pt is not on  ASA 2/2 daily naproxen use. Pt is prescribed statin. Diet: Low sodium,  Very little meat consumption  Exercise: Not exercising routinely.  RF: HTN, HLD, No Fhx    Depressive disorder:  Patient reports she is feeling great on  Effexor 75 mg daily. husband passed away in 05-25-20 and her daughter moved in with her .  She reports since that time she is adjusting really well and they are not getting along great together.     04/24/2022    8:48 AM 11/06/2021    8:09 AM 07/05/2021    2:19 PM 05/21/2021    8:11 AM 12/19/2020    9:23 AM  Depression screen PHQ 2/9  Decreased Interest 0 0 0 0 0  Down, Depressed, Hopeless 0 0 0 0 1  PHQ - 2 Score 0 0 0 0 1  Altered sleeping 0 0  0 0  Tired, decreased energy 0 0  1 1  Change in appetite 0 0  0 0  Feeling bad or failure about yourself  0 0  0 0  Trouble concentrating 0 0  0 0  Moving slowly or fidgety/restless 0 0  0 0  Suicidal thoughts 0 0  0 0  PHQ-9 Score 0 0  1 2      11/06/2021    8:09 AM 05/21/2021    8:12 AM 12/19/2020    9:24 AM 06/20/2020   11:05 AM  GAD 7 : Generalized Anxiety Score  Nervous, Anxious, on Edge 0 0 1 1  Control/stop worrying 0 1 0 1  Worry too much - different things 1 1 1 1   Trouble relaxing 0 0 0 0  Restless 0 0 0 0  Easily annoyed or irritable 0 0 0 0  Afraid - awful might happen 0 0 1 0  Total GAD 7 Score 1 2  3 3    No Known Allergies Social History   Tobacco Use   Smoking status: Never   Smokeless tobacco: Never  Substance Use Topics   Alcohol use: No   Past Medical History:  Diagnosis Date   Allergy    Anxiety    Arthritis    Depression    Hyperlipidemia    Hypertension    IFG (impaired fasting glucose)    A1c's all 5.9% or less   Vitamin D deficiency    Past Surgical History:  Procedure Laterality Date   TUBAL LIGATION     Family History  Problem Relation Age of Onset   Dementia Mother    Pneumonia Father    Rheum arthritis Paternal Uncle    Allergies as of 04/24/2022   No Known Allergies      Medication List        Accurate as of April 24, 2022  9:07 AM. If you have any questions, ask your nurse or doctor.  STOP taking these medications    diclofenac Sodium 1 % Gel Commonly known as: VOLTAREN Stopped by: Felix Pacini, DO   triamcinolone 55 MCG/ACT Aero nasal inhaler Commonly known as: NASACORT Stopped by: Felix Pacini, DO       TAKE these medications    CENTRUM ULTRA WOMENS PO Take by mouth daily.   FISH OIL PO Take by mouth.   irbesartan 300 MG tablet Commonly known as: AVAPRO Take 1 tablet (300 mg total) by mouth at bedtime.   naproxen sodium 220 MG tablet Commonly known as: ALEVE Take 220 mg by mouth in the morning and at bedtime.   rosuvastatin 10 MG tablet Commonly known as: CRESTOR TAKE 1 TABLET BY MOUTH MONDAY THROUGH FRIDAY   Tdap 5-2.5-18.5 LF-MCG/0.5 injection Commonly known as: BOOSTRIX Inject 0.5 mLs into the muscle once for 1 dose. Started by: Felix Pacini, DO   venlafaxine XR 75 MG 24 hr capsule Commonly known as: EFFEXOR-XR TAKE 1 CAPSULE BY MOUTH EVERY DAY   VITAMIN D-3 PO Take 2,000 Int'l Units by mouth daily.        All past medical history, surgical history, allergies, family history, immunizations andmedications were updated in the EMR today and reviewed under the history and medication  portions of their EMR.     ROS: Negative, with the exception of above mentioned in HPI   Objective:  BP 133/83   Pulse 82   Temp 98.3 F (36.8 C) (Oral)   Ht 5\' 4"  (1.626 m)   Wt 256 lb (116.1 kg)   SpO2 97%   BMI 43.94 kg/m  Body mass index is 43.94 kg/m. Physical Exam Vitals and nursing note reviewed.  Constitutional:      General: She is not in acute distress.    Appearance: Normal appearance. She is not ill-appearing, toxic-appearing or diaphoretic.  HENT:     Head: Normocephalic and atraumatic.  Eyes:     General: No scleral icterus.       Right eye: No discharge.        Left eye: No discharge.     Extraocular Movements: Extraocular movements intact.     Conjunctiva/sclera: Conjunctivae normal.     Pupils: Pupils are equal, round, and reactive to light.  Cardiovascular:     Rate and Rhythm: Normal rate and regular rhythm.  Pulmonary:     Effort: Pulmonary effort is normal. No respiratory distress.     Breath sounds: Normal breath sounds. No wheezing, rhonchi or rales.  Musculoskeletal:     Right lower leg: No edema.     Left lower leg: No edema.  Skin:    General: Skin is warm.     Findings: No rash.  Neurological:     Mental Status: She is alert and oriented to person, place, and time. Mental status is at baseline.     Motor: No weakness.     Gait: Gait normal.  Psychiatric:        Mood and Affect: Mood normal.        Behavior: Behavior normal.        Thought Content: Thought content normal.        Judgment: Judgment normal.      No results found. No results found. No results found for this or any previous visit (from the past 24 hour(s)).  Assessment/Plan: Brandy Bowman is a 72 y.o. female present for OV for  Essential hypertension, benign/HLD/morbid obesity Stable Continue irbesartan 300 mg daily - Watch added salt and  exercise > 150 minutes a week  Continue crestor 10 mg QD - No ASA use --> takes daily naproxen -CBC, CMP, troponin, lipid  and A1c collected  Depressive disorder Stable Continue Effexor to 75 mg daily  Vit d def: - supplementing 2000u daily. Vitamin D collected today    Reviewed expectations re: course of current medical issues. Discussed self-management of symptoms. Outlined signs and symptoms indicating need for more acute intervention. Patient verbalized understanding and all questions were answered. Patient received an After-Visit Summary.    Return in about 24 weeks (around 10/09/2022) for Routine chronic condition follow-up.  Orders Placed This Encounter  Procedures   CBC   Comp Met (CMET)   TSH   Lipid panel   Hemoglobin A1c   Vitamin D (25 hydroxy)    Meds ordered this encounter  Medications   irbesartan (AVAPRO) 300 MG tablet    Sig: Take 1 tablet (300 mg total) by mouth at bedtime.    Dispense:  90 tablet    Refill:  1   venlafaxine XR (EFFEXOR-XR) 75 MG 24 hr capsule    Sig: TAKE 1 CAPSULE BY MOUTH EVERY DAY    Dispense:  90 capsule    Refill:  1   Tdap (BOOSTRIX) 5-2.5-18.5 LF-MCG/0.5 injection    Sig: Inject 0.5 mLs into the muscle once for 1 dose.    Dispense:  0.5 mL    Refill:  0     Note is dictated utilizing voice recognition software. Although note has been proof read prior to signing, occasional typographical errors still can be missed. If any questions arise, please do not hesitate to call for verification.   electronically signed by:  Howard Pouch, DO  [place]

## 2022-04-24 NOTE — Patient Instructions (Addendum)
Return in about 24 weeks (around 10/09/2022) for Routine chronic condition follow-up.        Great to see you today.  I have refilled the medication(s) we provide.   If labs were collected, we will inform you of lab results once received either by echart message or telephone call.   - echart message- for normal results that have been seen by the patient already.   - telephone call: abnormal results or if patient has not viewed results in their echart.

## 2022-08-12 ENCOUNTER — Telehealth: Payer: Self-pay | Admitting: Family Medicine

## 2022-08-12 NOTE — Telephone Encounter (Signed)
Contacted Brandy Bowman to schedule their annual wellness visit. Appointment made for 08/14/2022.  [name] [facility] [facility] TEAM Direct Dial [phone]

## 2022-08-14 ENCOUNTER — Ambulatory Visit (INDEPENDENT_AMBULATORY_CARE_PROVIDER_SITE_OTHER): Payer: Medicare Other

## 2022-08-14 VITALS — Wt 256.0 lb

## 2022-08-14 DIAGNOSIS — Z0000 Encounter for general adult medical examination without abnormal findings: Secondary | ICD-10-CM

## 2022-08-14 NOTE — Progress Notes (Signed)
I connected with  Brandy Bowman on 08/14/22 by a audio enabled telemedicine application and verified that I am speaking with the correct person using two identifiers.  Patient Location: Home  Provider Location: Home Office  I discussed the limitations of evaluation and management by telemedicine. The patient expressed understanding and agreed to proceed.   Subjective:   Brandy Bowman is a 72 y.o. female who presents for Medicare Annual (Subsequent) preventive examination.  Review of Systems     Cardiac Risk Factors include: advanced age (>59men, >78 women);dyslipidemia;hypertension;obesity (BMI >30kg/m2);sedentary lifestyle     Objective:    Today's Vitals   08/14/22 1048  Weight: 256 lb (116.1 kg)   Body mass index is 43.94 kg/m.     08/14/2022   10:58 AM 07/05/2021    2:24 PM 02/16/2020    3:05 PM 03/11/2017    9:22 AM 03/08/2016    8:44 AM  Advanced Directives  Does Patient Have a Medical Advance Directive? Yes Yes Yes No No  Type of Estate agent of [place];Living will Healthcare Power of [place];Living will Healthcare Power of [place];Living will    Does patient want to make changes to medical advance directive?  No - Patient declined     Copy of Healthcare Power of Attorney in Chart? No - copy requested No - copy requested No - copy requested    Would patient like information on creating a medical advance directive?    No - Patient declined Yes (MAU/Ambulatory/Procedural Areas - Information given)    Current Medications (verified) Outpatient Encounter Medications as of 08/14/2022  Medication Sig   Cholecalciferol (VITAMIN D-3 PO) Take 2,000 Int'l Units by mouth daily.   irbesartan (AVAPRO) 300 MG tablet Take 1 tablet (300 mg total) by mouth at bedtime.   Multiple Vitamins-Minerals (CENTRUM ULTRA WOMENS PO) Take by mouth daily.   naproxen sodium (ALEVE) 220 MG tablet Take 220 mg by mouth in the morning and at bedtime.   Omega-3 Fatty Acids  (FISH OIL PO) Take by mouth.   rosuvastatin (CRESTOR) 10 MG tablet TAKE 1 TABLET BY MOUTH MONDAY THROUGH FRIDAY   venlafaxine XR (EFFEXOR-XR) 75 MG 24 hr capsule TAKE 1 CAPSULE BY MOUTH EVERY DAY   No facility-administered encounter medications on file as of 08/14/2022.    Allergies (verified) Patient has no known allergies.   History: Past Medical History:  Diagnosis Date   Allergy    Anxiety    Arthritis    Depression    Hyperlipidemia    Hypertension    IFG (impaired fasting glucose)    A1c's all 5.9% or less   Vitamin D deficiency    Past Surgical History:  Procedure Laterality Date   TUBAL LIGATION     Family History  Problem Relation Age of Onset   Dementia Mother    Pneumonia Father    Rheum arthritis Paternal Uncle    Social History   Socioeconomic History   Marital status: Widowed    Spouse name: Not on file   Number of children: 2   Years of education: Not on file   Highest education level: Not on file  Occupational History   Occupation: retired Agricultural consultant  Tobacco Use   Smoking status: Never   Smokeless tobacco: Never  Vaping Use   Vaping Use: Never used  Substance and Sexual Activity   Alcohol use: No   Drug use: No   Sexual activity: Never  Other Topics Concern   Not on file  Social History Narrative   ** Merged History Encounter **       Retired 4 years ago from [employer]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 Currently lives with husband who is several years older and is independent, but has some forgetfulness. Daughter lives in [place]. Does not have contact with son. Enjoys yard work.    Social Determinants of Health   Financial Resource Strain: Low Risk  (08/14/2022)   Overall Financial Resource Strain (CARDIA)    Difficulty of Paying Living Expenses: Not hard at all  Food Insecurity: No Food Insecurity (08/14/2022)   Hunger Vital Sign    Worried About Running Out of Food in the Last Year: Never true    Ran Out of Food in the Last Year:  Never true  Transportation Needs: No Transportation Needs (08/14/2022)   PRAPARE - Administrator, Civil Service (Medical): No    Lack of Transportation (Non-Medical): No  Physical Activity: Insufficiently Active (08/14/2022)   Exercise Vital Sign    Days of Exercise per Week: 5 days    Minutes of Exercise per Session: 10 min  Stress: No Stress Concern Present (08/14/2022)   [employer] of Occupational Health - Occupational Stress Questionnaire    Feeling of Stress : Not at all  Social Connections: Socially Isolated (08/14/2022)   Social Connection and Isolation Panel [NHANES]    Frequency of Communication with Friends and Family: More than three times a week    Frequency of Social Gatherings with Friends and Family: Once a week    Attends Religious Services: Never    Database administrator or Organizations: No    Attends Banker Meetings: Never    Marital Status: Widowed    Tobacco Counseling Counseling given: Not Answered   Clinical Intake:  Pre-visit preparation completed: Yes  Pain : No/denies pain     BMI - recorded: 43.94 Nutritional Status: BMI > 30  Obese Nutritional Risks: None Diabetes: No  How often do you need to have someone help you when you read instructions, pamphlets, or other written materials from your doctor or pharmacy?: 1 - Never  Diabetic?no  Interpreter Needed?: No  Information entered by :: Lanier Ensign, LPN   Activities of Daily Living    08/14/2022   10:58 AM  In your present state of health, do you have any difficulty performing the following activities:  Hearing? 0  Vision? 0  Difficulty concentrating or making decisions? 0  Walking or climbing stairs? 1  Comment has a  chair lift  Dressing or bathing? 0  Doing errands, shopping? 0  Preparing Food and eating ? N  Using the Toilet? N  In the past six months, have you accidently leaked urine? N  Do you have problems with loss of bowel control? N  Managing  your Medications? N  Managing your Finances? N  Housekeeping or managing your Housekeeping? N    Patient Care Team: Natalia Leatherwood, DO as PCP - General (Family Medicine)  Indicate any recent Medical Services you may have received from other than [facility] providers in the past year (date may be approximate).     Assessment:   This is a routine wellness examination for Brandy Bowman.  Hearing/Vision screen Hearing Screening - Comments:: Pt denies any hearing issues  Vision Screening - Comments:: Pt follows [facility] with [facility] for annual eye exams   Dietary issues and exercise activities discussed: Current Exercise Habits: Home exercise routine, Type of exercise: walking, Time (  Minutes): 10, Frequency (Times/Week): 5, Weekly Exercise (Minutes/Week): 50   Goals Addressed             This Visit's Progress    Patient Stated       None at this time        Depression Screen    08/14/2022   10:56 AM 04/24/2022    8:48 AM 11/06/2021    8:09 AM 07/05/2021    2:19 PM 05/21/2021    8:11 AM 12/19/2020    9:23 AM 06/20/2020   11:05 AM  PHQ 2/9 Scores  PHQ - 2 Score 0 0 0 0 0 1 0  PHQ- 9 Score  0 0  1 2 1     Fall Risk    08/14/2022   10:58 AM 04/24/2022    8:45 AM 07/05/2021    3:34 PM 05/21/2021    8:05 AM 06/20/2020   10:50 AM  Fall Risk   Falls in the past year? 0 0 0 0 1  Number falls in past yr: 0 0 0 0 0  Injury with Fall? 0 0 0 0 0  Risk for fall due to : Impaired vision No Fall Risks History of fall(s) No Fall Risks   Follow up Falls prevention discussed Falls evaluation completed Falls evaluation completed Falls evaluation completed Education provided    FALL RISK PREVENTION PERTAINING TO THE HOME:  Any stairs in or around the home? Yes  If so, are there any without handrails? No  Home free of loose throw rugs in walkways, pet beds, electrical cords, etc? Yes  Adequate lighting in your home to reduce risk of falls? Yes   ASSISTIVE DEVICES UTILIZED TO PREVENT FALLS:  Life alert?  No  Use of a cane, walker or w/c? Yes  Grab bars in the bathroom? No  Shower chair or bench in shower? Yes  Elevated toilet seat or a handicapped toilet? No   TIMED UP AND GO:  Was the test performed? No .   Cognitive Function:        08/14/2022   10:59 AM 07/05/2021    3:35 PM  6CIT Screen  What Year? 0 points 0 points  What month? 0 points 0 points  What time? 0 points 0 points  Count back from 20 0 points 0 points  Months in reverse 0 points 0 points  Repeat phrase 0 points 0 points  Total Score 0 points 0 points    Immunizations Immunization History  Administered Date(s) Administered   COVID-19, mRNA, vaccine(Comirnaty)12 years and older 02/11/2022   Fluad Quad(high Dose 65+) 12/16/2018, 12/20/2019, 02/11/2022   Influenza Split 12/28/2009   Influenza, High Dose Seasonal PF 01/05/2016, 01/20/2017, 01/28/2018, 01/28/2021   Influenza,inj,Quad PF,6+ Mos 01/13/2014, 01/06/2015   PFIZER Comirnaty(Gray Top)Covid-19 Tri-Sucrose Vaccine 11/04/2020   PFIZER(Purple Top)SARS-COV-2 Vaccination 05/15/2019, 06/09/2019, 01/14/2020   Pfizer Covid-19 Vaccine Bivalent Booster 22yrs & up 01/28/2021   Pneumococcal Conjugate-13 09/07/2015   Pneumococcal Polysaccharide-23 04/10/2017   Tdap 04/09/2011    TDAP status: Due, Education has been provided regarding the importance of this vaccine. Advised may receive this vaccine at local pharmacy or Health Dept. Aware to provide a copy of the vaccination record if obtained from local pharmacy or Health Dept. Verbalized acceptance and understanding.  Flu Vaccine status: Up to date  Pneumococcal vaccine status: Up to date  Covid-19 vaccine status: Completed vaccines  Qualifies for Shingles Vaccine? No     Screening Tests Health Maintenance  Topic Date Due  DTaP/Tdap/Td (2 - Td or Tdap) 04/08/2021   COVID-19 Vaccine (7 - 2023-24 season) 04/08/2022   MAMMOGRAM  08/14/2023 (Originally 06/07/2016)   DEXA SCAN  08/14/2023 (Originally  09/27/2019)   INFLUENZA VACCINE  11/07/2022   Medicare Annual Wellness (AWV)  08/14/2023   Fecal DNA (Cologuard)  07/20/2024   Pneumonia Vaccine 47+ Years old  Completed   Hepatitis C Screening  Completed   HPV VACCINES  Aged Out   COLONOSCOPY (Pts 45-79yrs Insurance coverage will need to be confirmed)  Discontinued   Zoster Vaccines- Shingrix  Discontinued    Health Maintenance  Health Maintenance Due  Topic Date Due   DTaP/Tdap/Td (2 - Td or Tdap) 04/08/2021   COVID-19 Vaccine (7 - 2023-24 season) 04/08/2022    Colorectal cancer screening: Type of screening: Cologuard. Completed 07/20/21. Repeat every 3 years  Mammogram status: Completed 06/08/14. Repeat every year  Bone Density status: Completed 09/26/09. Results reflect: Bone density results: OSTEOPENIA. Repeat every 10 years.   Additional Screening:  Hepatitis C Screening:  Completed 09/07/15  Vision Screening: Recommended annual ophthalmology exams for early detection of glaucoma and other disorders of the eye. Is the patient up to date with their annual eye exam?  Yes  Who is the provider or what is the name of the office in which the patient attends annual eye exams? [facility] If pt is not established with a provider, would they like to be referred to a provider to establish care? No .   Dental Screening: Recommended annual dental exams for proper oral hygiene  Community Resource Referral / Chronic Care Management: CRR required this visit?  No   CCM required this visit?  No      Plan:     I have personally reviewed and noted the following in the patient's chart:   Medical and social history Use of alcohol, tobacco or illicit drugs  Current medications and supplements including opioid prescriptions. Patient is not currently taking opioid prescriptions. Functional ability and status Nutritional status Physical activity Advanced directives List of other physicians Hospitalizations, surgeries, and ER visits in  previous 12 months Vitals Screenings to include cognitive, depression, and falls Referrals and appointments  In addition, I have reviewed and discussed with patient certain preventive protocols, quality metrics, and best practice recommendations. A written personalized care plan for preventive services as well as general preventive health recommendations were provided to patient.     Marzella Schlein, LPN   04/13/1094   Nurse Notes: none

## 2022-08-14 NOTE — Patient Instructions (Signed)
Brandy Bowman , Thank you for taking time to come for your Medicare Wellness Visit. I appreciate your ongoing commitment to your health goals. Please review the following plan we discussed and let me know if I can assist you in the future.   These are the goals we discussed:  Goals       Healthy Nutrition Achieved (pt-stated)      Evidence-based guidance:  Assess patient perspective on healthy weight, weight loss or weight gain, motivation and readiness for change.  Recommend or set healthy weight goal based on body mass index.  Review current dietary intake and exercise levels.  Encourage small steps toward making change to eating and exercising.  Provide individualized medical nutrition therapy.   Notes:       Patient Stated      Would like to lose weight      Patient Stated      None at this time         This is a list of the screening recommended for you and due dates:  Health Maintenance  Topic Date Due   Mammogram  06/07/2016   DEXA scan (bone density measurement)  09/27/2019   DTaP/Tdap/Td vaccine (2 - Td or Tdap) 04/08/2021   COVID-19 Vaccine (7 - 2023-24 season) 04/08/2022   Flu Shot  11/07/2022   Medicare Annual Wellness Visit  08/14/2023   Cologuard (Stool DNA test)  07/20/2024   Pneumonia Vaccine  Completed   Hepatitis C Screening: USPSTF Recommendation to screen - Ages 18-79 yo.  Completed   HPV Vaccine  Aged Out   Colon Cancer Screening  Discontinued   Zoster (Shingles) Vaccine  Discontinued    Advanced directives: Please bring a copy of your health care power of attorney and living will to the office at your convenience.   Conditions/risks identified: none at this time   Next appointment: Follow up in one year for your annual wellness visit    Preventive Care 65 Years and Older, Female Preventive care refers to lifestyle choices and visits with your health care provider that can promote health and wellness. What does preventive care include? A  yearly physical exam. This is also called an annual well check. Dental exams once or twice a year. Routine eye exams. Ask your health care provider how often you should have your eyes checked. Personal lifestyle choices, including: Daily care of your teeth and gums. Regular physical activity. Eating a healthy diet. Avoiding tobacco and drug use. Limiting alcohol use. Practicing safe sex. Taking low-dose aspirin every day. Taking vitamin and mineral supplements as recommended by your health care provider. What happens during an annual well check? The services and screenings done by your health care provider during your annual well check will depend on your age, overall health, lifestyle risk factors, and family history of disease. Counseling  Your health care provider may ask you questions about your: Alcohol use. Tobacco use. Drug use. Emotional well-being. Home and relationship well-being. Sexual activity. Eating habits. History of falls. Memory and ability to understand (cognition). Work and work Astronomer. Reproductive health. Screening  You may have the following tests or measurements: Height, weight, and BMI. Blood pressure. Lipid and cholesterol levels. These may be checked every 5 years, or more frequently if you are over 63 years old. Skin check. Lung cancer screening. You may have this screening every year starting at age 21 if you have a 30-pack-year history of smoking and currently smoke or have quit within the past 15  years. Fecal occult blood test (FOBT) of the stool. You may have this test every year starting at age 59. Flexible sigmoidoscopy or colonoscopy. You may have a sigmoidoscopy every 5 years or a colonoscopy every 10 years starting at age 57. Hepatitis C blood test. Hepatitis B blood test. Sexually transmitted disease (STD) testing. Diabetes screening. This is done by checking your blood sugar (glucose) after you have not eaten for a while (fasting).  You may have this done every 1-3 years. Bone density scan. This is done to screen for osteoporosis. You may have this done starting at age 67. Mammogram. This may be done every 1-2 years. Talk to your health care provider about how often you should have regular mammograms. Talk with your health care provider about your test results, treatment options, and if necessary, the need for more tests. Vaccines  Your health care provider may recommend certain vaccines, such as: Influenza vaccine. This is recommended every year. Tetanus, diphtheria, and acellular pertussis (Tdap, Td) vaccine. You may need a Td booster every 10 years. Zoster vaccine. You may need this after age 5. Pneumococcal 13-valent conjugate (PCV13) vaccine. One dose is recommended after age 63. Pneumococcal polysaccharide (PPSV23) vaccine. One dose is recommended after age 11. Talk to your health care provider about which screenings and vaccines you need and how often you need them. This information is not intended to replace advice given to you by your health care provider. Make sure you discuss any questions you have with your health care provider. Document Released: 04/21/2015 Document Revised: 12/13/2015 Document Reviewed: 01/24/2015 Elsevier Interactive Patient Education  2017 [employer].  Fall Prevention in the Home Falls can cause injuries. They can happen to people of all ages. There are many things you can do to make your home safe and to help prevent falls. What can I do on the outside of my home? Regularly fix the edges of walkways and driveways and fix any cracks. Remove anything that might make you trip as you walk through a door, such as a raised step or threshold. Trim any bushes or trees on the path to your home. Use bright outdoor lighting. Clear any walking paths of anything that might make someone trip, such as rocks or tools. Regularly check to see if handrails are loose or broken. Make sure that both sides  of any steps have handrails. Any raised decks and porches should have guardrails on the edges. Have any leaves, snow, or ice cleared regularly. Use sand or salt on walking paths during winter. Clean up any spills in your garage right away. This includes oil or grease spills. What can I do in the bathroom? Use night lights. Install grab bars by the toilet and in the tub and shower. Do not use towel bars as grab bars. Use non-skid mats or decals in the tub or shower. If you need to sit down in the shower, use a plastic, non-slip stool. Keep the floor dry. Clean up any water that spills on the floor as soon as it happens. Remove soap buildup in the tub or shower regularly. Attach bath mats securely with double-sided non-slip rug tape. Do not have throw rugs and other things on the floor that can make you trip. What can I do in the bedroom? Use night lights. Make sure that you have a light by your bed that is easy to reach. Do not use any sheets or blankets that are too big for your bed. They should not hang down  onto the floor. Have a firm chair that has side arms. You can use this for support while you get dressed. Do not have throw rugs and other things on the floor that can make you trip. What can I do in the kitchen? Clean up any spills right away. Avoid walking on wet floors. Keep items that you use a lot in easy-to-reach places. If you need to reach something above you, use a strong step stool that has a grab bar. Keep electrical cords out of the way. Do not use floor polish or wax that makes floors slippery. If you must use wax, use non-skid floor wax. Do not have throw rugs and other things on the floor that can make you trip. What can I do with my stairs? Do not leave any items on the stairs. Make sure that there are handrails on both sides of the stairs and use them. Fix handrails that are broken or loose. Make sure that handrails are as long as the stairways. Check any  carpeting to make sure that it is firmly attached to the stairs. Fix any carpet that is loose or worn. Avoid having throw rugs at the top or bottom of the stairs. If you do have throw rugs, attach them to the floor with carpet tape. Make sure that you have a light switch at the top of the stairs and the bottom of the stairs. If you do not have them, ask someone to add them for you. What else can I do to help prevent falls? Wear shoes that: Do not have high heels. Have rubber bottoms. Are comfortable and fit you well. Are closed at the toe. Do not wear sandals. If you use a stepladder: Make sure that it is fully opened. Do not climb a closed stepladder. Make sure that both sides of the stepladder are locked into place. Ask someone to hold it for you, if possible. Clearly mark and make sure that you can see: Any grab bars or handrails. First and last steps. Where the edge of each step is. Use tools that help you move around (mobility aids) if they are needed. These include: Canes. Walkers. Scooters. Crutches. Turn on the lights when you go into a dark area. Replace any light bulbs as soon as they burn out. Set up your furniture so you have a clear path. Avoid moving your furniture around. If any of your floors are uneven, fix them. If there are any pets around you, be aware of where they are. Review your medicines with your doctor. Some medicines can make you feel dizzy. This can increase your chance of falling. Ask your doctor what other things that you can do to help prevent falls. This information is not intended to replace advice given to you by your health care provider. Make sure you discuss any questions you have with your health care provider. Document Released: 01/19/2009 Document Revised: 08/31/2015 Document Reviewed: 04/29/2014 Elsevier Interactive Patient Education  2017 [employer].

## 2022-10-09 ENCOUNTER — Encounter: Payer: Self-pay | Admitting: Family Medicine

## 2022-10-09 ENCOUNTER — Ambulatory Visit (INDEPENDENT_AMBULATORY_CARE_PROVIDER_SITE_OTHER): Payer: Medicare Other | Admitting: Family Medicine

## 2022-10-09 VITALS — BP 127/75 | HR 78 | Temp 98.0°F | Wt 259.6 lb

## 2022-10-09 DIAGNOSIS — Z6841 Body Mass Index (BMI) 40.0 and over, adult: Secondary | ICD-10-CM | POA: Diagnosis not present

## 2022-10-09 DIAGNOSIS — I10 Essential (primary) hypertension: Secondary | ICD-10-CM | POA: Diagnosis not present

## 2022-10-09 DIAGNOSIS — E559 Vitamin D deficiency, unspecified: Secondary | ICD-10-CM

## 2022-10-09 MED ORDER — VENLAFAXINE HCL ER 75 MG PO CP24: ORAL_CAPSULE | ORAL | 1 refills | Status: DC

## 2022-10-09 MED ORDER — TETANUS-DIPHTH-ACELL PERTUSSIS 5-2.5-18.5 LF-MCG/0.5 IM SUSP: 0.5000 mL | Freq: Once | INTRAMUSCULAR | 0 refills | Status: AC

## 2022-10-09 MED ORDER — IRBESARTAN 300 MG PO TABS: 300.0000 mg | ORAL_TABLET | Freq: Every day | ORAL | 1 refills | Status: DC

## 2022-10-09 MED ORDER — ROSUVASTATIN CALCIUM 10 MG PO TABS: ORAL_TABLET | ORAL | 3 refills | Status: DC

## 2022-10-09 NOTE — Progress Notes (Signed)
Brandy Bowman , 06-Dec-1950, 72 y.o., female MRN: [id number] Patient Care Team    Relationship Specialty Notifications Start End  Natalia Leatherwood, DO PCP - General Family Medicine  03/08/16     Chief Complaint  Patient presents with   Hypertension      Subjective: Brandy Bowman is a 72 y.o. present for [facility] followup Essential hypertension, benign//hyperlipidemia/morbid obesity:  Pt reports compliance with Crestor 10 mg, fish oil supplement and irbesartan 300 mg. Patient denies chest pain, shortness of breath, dizziness or lower extremity edema.   Pt is not on  ASA 2/2 daily naproxen use. Pt is prescribed statin. Diet: Low sodium,  Very little meat consumption  Exercise: Not exercising routinely.  RF: HTN, HLD, No Fhx    Depressive disorder:  Patient reports she is feeling very well on  Effexor 75 mg daily. husband passed away in February 19, 2022and her daughter moved in with her .  She reports since that time she is adjusting really well and they are getting along great together.     08/14/2022   10:56 AM 04/24/2022    8:48 AM 11/06/2021    8:09 AM 07/05/2021    2:19 PM 05/21/2021    8:11 AM  Depression screen PHQ 2/9  Decreased Interest 0 0 0 0 0  Down, Depressed, Hopeless 0 0 0 0 0  PHQ - 2 Score 0 0 0 0 0  Altered sleeping  0 0  0  Tired, decreased energy  0 0  1  Change in appetite  0 0  0  Feeling bad or failure about yourself   0 0  0  Trouble concentrating  0 0  0  Moving slowly or fidgety/restless  0 0  0  Suicidal thoughts  0 0  0  PHQ-9 Score  0 0  1      11/06/2021    8:09 AM 05/21/2021    8:12 AM 12/19/2020    9:24 AM 06/20/2020   11:05 AM  GAD 7 : Generalized Anxiety Score  Nervous, Anxious, on Edge 0 0 1 1  Control/stop worrying 0 1 0 1  Worry too much - different things 1 1 1 1   Trouble relaxing 0 0 0 0  Restless 0 0 0 0  Easily annoyed or irritable 0 0 0 0  Afraid - awful might happen 0 0 1 0  Total GAD 7 Score 1 2 3 3    No Known  Allergies Social History   Tobacco Use   Smoking status: Never   Smokeless tobacco: Never  Substance Use Topics   Alcohol use: No   Past Medical History:  Diagnosis Date   Allergy    Anxiety    Arthritis    Depression    Hyperlipidemia    Hypertension    IFG (impaired fasting glucose)    A1c's all 5.9% or less   Vitamin D deficiency    Past Surgical History:  Procedure Laterality Date   TUBAL LIGATION     Family History  Problem Relation Age of Onset   Dementia Mother    Pneumonia Father    Rheum arthritis Paternal Uncle    Allergies as of 10/09/2022   No Known Allergies      Medication List        Accurate as of October 09, 2022  9:48 AM. If you have any questions, ask your nurse or doctor.  CENTRUM ULTRA WOMENS PO Take by mouth daily.   FISH OIL PO Take by mouth.   irbesartan 300 MG tablet Commonly known as: AVAPRO Take 1 tablet (300 mg total) by mouth at bedtime.   naproxen sodium 220 MG tablet Commonly known as: ALEVE Take 220 mg by mouth in the morning and at bedtime.   rosuvastatin 10 MG tablet Commonly known as: CRESTOR TAKE 1 TABLET BY MOUTH MONDAY THROUGH FRIDAY   Tdap 5-2.5-18.5 LF-MCG/0.5 injection Commonly known as: BOOSTRIX Inject 0.5 mLs into the muscle once for 1 dose. Started by: Felix Pacini, DO   venlafaxine XR 75 MG 24 hr capsule Commonly known as: EFFEXOR-XR TAKE 1 CAPSULE BY MOUTH EVERY DAY   VITAMIN D-3 PO Take 2,000 Int'l Units by mouth daily.        All past medical history, surgical history, allergies, family history, immunizations andmedications were updated in the EMR today and reviewed under the history and medication portions of their EMR.     ROS: Negative, with the exception of above mentioned in HPI   Objective:  BP 127/75   Pulse 78   Temp 98 F (36.7 C)   Wt 259 lb 9.6 oz (117.8 kg)   SpO2 97%   BMI 44.56 kg/m  Body mass index is 44.56 kg/m. Physical Exam Vitals and nursing note  reviewed.  Constitutional:      General: She is not in acute distress.    Appearance: Normal appearance. She is not ill-appearing, toxic-appearing or diaphoretic.  HENT:     Head: Normocephalic and atraumatic.  Eyes:     General: No scleral icterus.       Right eye: No discharge.        Left eye: No discharge.     Extraocular Movements: Extraocular movements intact.     Conjunctiva/sclera: Conjunctivae normal.     Pupils: Pupils are equal, round, and reactive to light.  Cardiovascular:     Rate and Rhythm: Normal rate and regular rhythm.  Pulmonary:     Effort: Pulmonary effort is normal. No respiratory distress.     Breath sounds: Normal breath sounds. No wheezing, rhonchi or rales.  Musculoskeletal:     Right lower leg: No edema.     Left lower leg: No edema.  Skin:    General: Skin is warm.     Findings: No rash.  Neurological:     Mental Status: She is alert and oriented to person, place, and time. Mental status is at baseline.     Motor: No weakness.     Gait: Gait normal.  Psychiatric:        Mood and Affect: Mood normal.        Behavior: Behavior normal.        Thought Content: Thought content normal.        Judgment: Judgment normal.     No results found. No results found. No results found for this or any previous visit (from the past 24 hour(s)).  Assessment/Plan: Holliday Bowman is a 72 y.o. female present for OV for  Essential hypertension, benign/HLD/morbid obesity Stable Continue irbesartan 300 mg daily - Watch added salt and exercise > 150 minutes a week  Continue crestor 10 mg QD - No ASA use --> takes daily naproxen Labs due next visit  Depressive disorder Stable Continue Effexor to 75 mg daily  Vit d def: - supplementing 2000u daily. Vitamin D due next visit   Reviewed expectations re: course of current medical issues.  Discussed self-management of symptoms. Outlined signs and symptoms indicating need for more acute intervention. Patient  verbalized understanding and all questions were answered. Patient received an After-Visit Summary.    Return in about 24 weeks (around 03/26/2023) for Routine chronic condition follow-up.  No orders of the defined types were placed in this encounter.   Meds ordered this encounter  Medications   irbesartan (AVAPRO) 300 MG tablet    Sig: Take 1 tablet (300 mg total) by mouth at bedtime.    Dispense:  90 tablet    Refill:  1   rosuvastatin (CRESTOR) 10 MG tablet    Sig: TAKE 1 TABLET BY MOUTH MONDAY THROUGH FRIDAY    Dispense:  60 tablet    Refill:  3   venlafaxine XR (EFFEXOR-XR) 75 MG 24 hr capsule    Sig: TAKE 1 CAPSULE BY MOUTH EVERY DAY    Dispense:  90 capsule    Refill:  1   Tdap (BOOSTRIX) 5-2.5-18.5 LF-MCG/0.5 injection    Sig: Inject 0.5 mLs into the muscle once for 1 dose.    Dispense:  0.5 mL    Refill:  0     Note is dictated utilizing voice recognition software. Although note has been proof read prior to signing, occasional typographical errors still can be missed. If any questions arise, please do not hesitate to call for verification.   electronically signed by:  Felix Pacini, DO  [facility] [place]

## 2022-10-09 NOTE — Patient Instructions (Addendum)
Return in about 24 weeks (around 03/26/2023) for Routine chronic condition follow-up.  Next appt please fast for labs.       Great to see you today.  I have refilled the medication(s) we provide.   If labs were collected, we will inform you of lab results once received either by echart message or telephone call.   - echart message- for normal results that have been seen by the patient already.   - telephone call: abnormal results or if patient has not viewed results in their echart.

## 2023-01-23 ENCOUNTER — Telehealth: Payer: Self-pay | Admitting: Family Medicine

## 2023-01-23 NOTE — Telephone Encounter (Signed)
Ok with me. Very nice patient.

## 2023-01-23 NOTE — Telephone Encounter (Signed)
Pt called about her just moving here to [facility] and is no longer close to the [place] location. She's interested in a TOC and becoming a pt at [facility] with Dr Oliver Barre. We just wanted to take the correct process in getting this request completed before setting her up for an appointment. Someone please advise.  Thanks.

## 2023-01-23 NOTE — Telephone Encounter (Signed)
Ok with me, thanks.

## 2023-02-18 ENCOUNTER — Encounter: Payer: Medicare Other | Admitting: Internal Medicine

## 2023-03-04 ENCOUNTER — Encounter: Payer: Self-pay | Admitting: Internal Medicine

## 2023-03-04 ENCOUNTER — Ambulatory Visit: Payer: Medicare Other | Admitting: Internal Medicine

## 2023-03-04 VITALS — BP 126/80 | HR 83 | Temp 98.4°F | Ht 64.0 in | Wt 260.0 lb

## 2023-03-04 DIAGNOSIS — K089 Disorder of teeth and supporting structures, unspecified: Secondary | ICD-10-CM

## 2023-03-04 DIAGNOSIS — E559 Vitamin D deficiency, unspecified: Secondary | ICD-10-CM

## 2023-03-04 DIAGNOSIS — E538 Deficiency of other specified B group vitamins: Secondary | ICD-10-CM

## 2023-03-04 DIAGNOSIS — R739 Hyperglycemia, unspecified: Secondary | ICD-10-CM | POA: Diagnosis not present

## 2023-03-04 DIAGNOSIS — I10 Essential (primary) hypertension: Secondary | ICD-10-CM

## 2023-03-04 DIAGNOSIS — E782 Mixed hyperlipidemia: Secondary | ICD-10-CM | POA: Diagnosis not present

## 2023-03-04 DIAGNOSIS — F330 Major depressive disorder, recurrent, mild: Secondary | ICD-10-CM

## 2023-03-04 LAB — URINALYSIS, ROUTINE W REFLEX MICROSCOPIC
Bilirubin Urine: NEGATIVE
Ketones, ur: NEGATIVE
Nitrite: NEGATIVE
Specific Gravity, Urine: 1.015 (ref 1.000–1.030)
Total Protein, Urine: NEGATIVE
Urine Glucose: NEGATIVE
Urobilinogen, UA: 0.2 (ref 0.0–1.0)
pH: 7 (ref 5.0–8.0)

## 2023-03-04 LAB — HEPATIC FUNCTION PANEL
ALT: 21 U/L (ref 0–35)
AST: 20 U/L (ref 0–37)
Albumin: 4.4 g/dL (ref 3.5–5.2)
Alkaline Phosphatase: 86 U/L (ref 39–117)
Bilirubin, Direct: 0.1 mg/dL (ref 0.0–0.3)
Total Bilirubin: 0.5 mg/dL (ref 0.2–1.2)
Total Protein: 7.4 g/dL (ref 6.0–8.3)

## 2023-03-04 LAB — LIPID PANEL
Cholesterol: 186 mg/dL (ref 0–200)
HDL: 47.1 mg/dL (ref >39.00)
LDL Cholesterol: 101 mg/dL — ABNORMAL HIGH (ref 0–99)
NonHDL: 138.5
Total CHOL/HDL Ratio: 4
Triglycerides: 189 mg/dL — ABNORMAL HIGH (ref 0.0–149.0)
VLDL: 37.8 mg/dL (ref 0.0–40.0)

## 2023-03-04 LAB — CBC WITH DIFFERENTIAL/PLATELET
Basophils Absolute: 0.1 10*3/uL (ref 0.0–0.1)
Basophils Relative: 0.9 % (ref 0.0–3.0)
Eosinophils Absolute: 0.2 10*3/uL (ref 0.0–0.7)
Eosinophils Relative: 2.4 % (ref 0.0–5.0)
HCT: 42 % (ref 36.0–46.0)
Hemoglobin: 13.8 g/dL (ref 12.0–15.0)
Lymphocytes Relative: 27.9 % (ref 12.0–46.0)
Lymphs Abs: 2.5 10*3/uL (ref 0.7–4.0)
MCHC: 32.9 g/dL (ref 30.0–36.0)
MCV: 91.7 fL (ref 78.0–100.0)
Monocytes Absolute: 0.6 10*3/uL (ref 0.1–1.0)
Monocytes Relative: 7.3 % (ref 3.0–12.0)
Neutro Abs: 5.5 10*3/uL (ref 1.4–7.7)
Neutrophils Relative %: 61.5 % (ref 43.0–77.0)
Platelets: 304 10*3/uL (ref 150.0–400.0)
RBC: 4.58 Mil/uL (ref 3.87–5.11)
RDW: 13.7 % (ref 11.5–15.5)
WBC: 8.9 10*3/uL (ref 4.0–10.5)

## 2023-03-04 LAB — BASIC METABOLIC PANEL
BUN: 17 mg/dL (ref 6–23)
CO2: 29 meq/L (ref 19–32)
Calcium: 9.6 mg/dL (ref 8.4–10.5)
Chloride: 105 meq/L (ref 96–112)
Creatinine, Ser: 0.57 mg/dL (ref 0.40–1.20)
GFR: 90.44 mL/min (ref >60.00)
Glucose, Bld: 97 mg/dL (ref 70–99)
Potassium: 3.9 meq/L (ref 3.5–5.1)
Sodium: 142 meq/L (ref 135–145)

## 2023-03-04 LAB — MICROALBUMIN / CREATININE URINE RATIO
Creatinine,U: 68.5 mg/dL
Microalb Creat Ratio: 1.3 mg/g (ref 0.0–30.0)
Microalb, Ur: 0.9 mg/dL (ref 0.0–1.9)

## 2023-03-04 LAB — HEMOGLOBIN A1C: Hgb A1c MFr Bld: 6.2 % (ref 4.6–6.5)

## 2023-03-04 LAB — VITAMIN B12: Vitamin B-12: 525 pg/mL (ref 211–911)

## 2023-03-04 LAB — TSH: TSH: 3.09 u[IU]/mL (ref 0.35–5.50)

## 2023-03-04 LAB — VITAMIN D 25 HYDROXY (VIT D DEFICIENCY, FRACTURES): VITD: 30.69 ng/mL (ref 30.00–100.00)

## 2023-03-04 NOTE — Patient Instructions (Signed)
Please let me know about refills, or if you change your mind about the Bone Density testing, mammogram, or referral to Dr Lequita Halt orthopedic  Please otherwise consider seeing Sports Medicine on the first floor (no referral needed) if the knees flare up  Please continue all other medications as before, and refills have been done if requested.  Please have the pharmacy call with any other refills you may need.  Please continue your efforts at being more active, low cholesterol diet, and weight control.  You are otherwise up to date with prevention measures today.  Please keep your appointments with your specialists as you may have planned  Please go to the LAB at the blood drawing area for the tests to be done  You will be contacted by phone if any changes need to be made immediately.  Otherwise, you will receive a letter about your results with an explanation, but please check with MyChart first.  Please make an Appointment to return in 6 months, or sooner if needed

## 2023-03-04 NOTE — Progress Notes (Signed)
Patient ID: Brandy Bowman, female   DOB: 22-Feb-1951, 72 y.o.   MRN: [id number]        Chief Complaint: follow up new pt to me in transfer  - low vit d, hld, htn, knee arthritis, poor dentition, hyperglycemia       HPI:  Brandy Bowman is a 72 y.o. female retired former work for [employer] and marked aversion to [employer] and medications it seems; Pt denies chest pain, increased sob or doe, wheezing, orthopnea, PND, increased LE swelling, palpitations, dizziness or syncope.   Pt denies polydipsia, polyuria, or new focal neuro s/s.    Pt denies fever, wt loss, night sweats, loss of appetite, or other constitutional symptoms  Pt is adamant not interested in DXA, mammogram.  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  Is high risk for falling, walks with walker.  Has fallen recently at least once, no injury.  Denies worsening depressive symptoms, suicidal ideation, or panic       Wt Readings from Last 3 Encounters:  03/04/23 260 lb (117.9 kg)  10/09/22 259 lb 9.6 oz (117.8 kg)  08/14/22 256 lb (116.1 kg)   BP Readings from Last 3 Encounters:  03/04/23 126/80  10/09/22 127/75  04/24/22 133/83         Past Medical History:  Diagnosis Date   Allergy    Anxiety    Arthritis    Depression    Hyperlipidemia    Hypertension    IFG (impaired fasting glucose)    A1c's all 5.9% or less   Vitamin D deficiency    Past Surgical History:  Procedure Laterality Date   TUBAL LIGATION      reports that she has never smoked. She has never used smokeless tobacco. She reports that she does not drink alcohol and does not use drugs. family history includes Dementia in her mother; Pneumonia in her father; Rheum arthritis in her paternal uncle. No Known Allergies Current Outpatient Medications on File Prior to Visit  Medication Sig Dispense Refill   Cholecalciferol (VITAMIN D-3 PO) Take 2,000 Int'l Units by mouth daily.     irbesartan (AVAPRO) 300 MG tablet Take 1 tablet (300  mg total) by mouth at bedtime. 90 tablet 1   Multiple Vitamins-Minerals (CENTRUM ULTRA WOMENS PO) Take by mouth daily.     naproxen sodium (ALEVE) 220 MG tablet Take 220 mg by mouth in the morning and at bedtime.     Omega-3 Fatty Acids (FISH OIL PO) Take by mouth.     rosuvastatin (CRESTOR) 10 MG tablet TAKE 1 TABLET BY MOUTH MONDAY THROUGH FRIDAY 60 tablet 3   venlafaxine XR (EFFEXOR-XR) 75 MG 24 hr capsule TAKE 1 CAPSULE BY MOUTH EVERY DAY 90 capsule 1   No current facility-administered medications on file prior to visit.        ROS:  All others reviewed and negative.  Objective        PE:  BP 126/80 (BP Location: Left Arm, Patient Position: Sitting, Cuff Size: Normal)   Pulse 83   Temp 98.4 F (36.9 C) (Oral)   Ht 5\' 4"  (1.626 m)   Wt 260 lb (117.9 kg)   SpO2 98%   BMI 44.63 kg/m                 Constitutional: Pt appears in NAD               HENT: Head: NCAT.  Right Ear: External ear normal.                 Left Ear: External ear normal.                Eyes: . Pupils are equal, round, and reactive to light. Conjunctivae and EOM are normal               Nose: without d/c or deformity               Neck: Neck supple. Gross normal ROM               Cardiovascular: Normal rate and regular rhythm.                 Pulmonary/Chest: Effort normal and breath sounds without rales or wheezing.                Abd:  Soft, NT, ND, + BS, no organomegaly               Neurological: Pt is alert. At baseline orientation, motor grossly intact               Skin: Skin is warm. No rashes, no other new lesions, LE edema - none Bilateral knees with severe degnerative changes               Psychiatric: Pt behavior is normal without agitation , depressed affect  Micro: none  Cardiac tracings I have personally interpreted today:  none  Pertinent Radiological findings (summarize): none   Lab Results  Component Value Date   WBC 8.9 03/04/2023   HGB 13.8 03/04/2023   HCT 42.0  03/04/2023   PLT 304.0 03/04/2023   GLUCOSE 97 03/04/2023   CHOL 186 03/04/2023   TRIG 189.0 (H) 03/04/2023   HDL 47.10 03/04/2023   LDLDIRECT 141.0 06/20/2020   LDLCALC 101 (H) 03/04/2023   ALT 21 03/04/2023   AST 20 03/04/2023   NA 142 03/04/2023   K 3.9 03/04/2023   CL 105 03/04/2023   CREATININE 0.57 03/04/2023   BUN 17 03/04/2023   CO2 29 03/04/2023   TSH 3.09 03/04/2023   HGBA1C 6.2 03/04/2023   MICROALBUR 0.9 03/04/2023   Assessment/Plan:  Brandy Bowman is a 72 y.o. White or Caucasian [1] female with  has a past medical history of Allergy, Anxiety, Arthritis, Depression, Hyperlipidemia, Hypertension, IFG (impaired fasting glucose), and Vitamin D deficiency.  Vitamin D deficiency Last vitamin D Lab Results  Component Value Date   VD25OH 30.69 03/04/2023   Low, to start oral replacement   Hyperlipidemia Lab Results  Component Value Date   LDLCALC 101 (H) 03/04/2023   Uncontrolled, pt to restart crestor 10 qd   Essential hypertension, benign BP Readings from Last 3 Encounters:  03/04/23 126/80  10/09/22 127/75  04/24/22 133/83   Stable, pt to continue medical treatment avapro 300 qd   depressive disorder, recurrent episode, mild (HCC) Overall stable, continue effexor xr 75 qd  Hyperglycemia Lab Results  Component Value Date   HGBA1C 6.2 03/04/2023   Stable, pt to continue current medical treatment  - diet,wt control   Poor dentition Asympt currently, pt encouraged to f/u dental asap  Followup: Return in about 6 months (around 09/01/2023).  Oliver Barre, MD 03/07/2023 5:14 PM [facility]

## 2023-03-04 NOTE — Progress Notes (Signed)
The test results show that your current treatment is OK, as the tests are stable.  Please continue the same plan.  There is no other need for change of treatment or further evaluation based on these results, at this time.  thanks 

## 2023-03-07 ENCOUNTER — Encounter: Payer: Self-pay | Admitting: Internal Medicine

## 2023-03-07 DIAGNOSIS — R739 Hyperglycemia, unspecified: Secondary | ICD-10-CM | POA: Insufficient documentation

## 2023-03-07 DIAGNOSIS — K089 Disorder of teeth and supporting structures, unspecified: Secondary | ICD-10-CM | POA: Insufficient documentation

## 2023-03-07 NOTE — Assessment & Plan Note (Signed)
Last vitamin D Lab Results  Component Value Date   VD25OH 30.69 03/04/2023   Low, to start oral replacement

## 2023-03-07 NOTE — Assessment & Plan Note (Signed)
Lab Results  Component Value Date   HGBA1C 6.2 03/04/2023   Stable, pt to continue current medical treatment  - diet,wt control

## 2023-03-07 NOTE — Assessment & Plan Note (Signed)
Lab Results  Component Value Date   LDLCALC 101 (H) 03/04/2023   Uncontrolled, pt to restart crestor 10 qd

## 2023-03-07 NOTE — Assessment & Plan Note (Signed)
Overall stable, continue effexor xr 75 qd

## 2023-03-07 NOTE — Assessment & Plan Note (Signed)
Asympt currently, pt encouraged to f/u dental asap

## 2023-03-07 NOTE — Assessment & Plan Note (Signed)
BP Readings from Last 3 Encounters:  03/04/23 126/80  10/09/22 127/75  04/24/22 133/83   Stable, pt to continue medical treatment avapro 300 qd

## 2023-05-15 ENCOUNTER — Other Ambulatory Visit: Payer: Self-pay | Admitting: Internal Medicine

## 2023-05-15 NOTE — Telephone Encounter (Signed)
 Copied from CRM [id number]. Topic: Clinical - Medication Refill >> May 15, 2023  8:40 AM Burnard DEL wrote: Most Recent Primary Care Visit:  Provider: NORLEEN [facility]  Department: [facility]  Visit Type: TRANSFER OF CARE  Date: 03/04/2023  Medication: irbesartan  (AVAPRO ) 300 MG tablet  rosuvastatin  (CRESTOR ) 10 MG tablet  venlafaxine  XR (EFFEXOR -XR) 75 MG 24 hr capsule  Has the patient contacted their pharmacy? Yes (Agent: If no, request that the patient contact the pharmacy for the refill. If patient does not wish to contact the pharmacy document the reason why and proceed with request.) (Agent: If yes, when and what did the pharmacy advise?)  Is this the correct pharmacy for this prescription? Yes If no, delete pharmacy and type the correct one.  This is the patient's preferred pharmacy:    [id number] [place], [place] - [address] [place] [address] Phone: [phone] Fax: [phone]   Has the prescription been filled recently? Yes  Is the patient out of the medication? No  Has the patient been seen for an appointment in the last year OR does the patient have an upcoming appointment? Yes  Can we respond through MyChart? Yes  Agent: Please be advised that Rx refills may take up to 3 business days. We ask that you follow-up with your pharmacy.

## 2023-05-16 ENCOUNTER — Other Ambulatory Visit: Payer: Self-pay

## 2023-05-16 ENCOUNTER — Other Ambulatory Visit: Payer: Self-pay | Admitting: Internal Medicine

## 2023-05-16 MED ORDER — ROSUVASTATIN CALCIUM 10 MG PO TABS: ORAL_TABLET | ORAL | 3 refills | Status: DC

## 2023-05-16 MED ORDER — IRBESARTAN 300 MG PO TABS: 300.0000 mg | ORAL_TABLET | Freq: Every day | ORAL | 1 refills | Status: DC

## 2023-05-16 MED ORDER — VENLAFAXINE HCL ER 75 MG PO CP24: ORAL_CAPSULE | ORAL | 1 refills | Status: DC

## 2023-05-16 NOTE — Telephone Encounter (Signed)
 Copied from CRM [id number]. Topic: Clinical - Medication Refill >> May 16, 2023  1:45 PM Mercedes MATSU wrote: Most Recent Primary Care Visit:  Provider: NORLEEN LYNWOOD ORN  Department: [facility] [place]  Visit Type: TRANSFER OF CARE  Date: 03/04/2023  Medication: irbesartan  (AVAPRO ) 300 MG tablet rosuvastatin  (CRESTOR ) 10 MG tablet venlafaxine  XR (EFFEXOR -XR) 75 MG 24 hr capsule  Has the patient contacted their pharmacy? Yes (Agent: If no, request that the patient contact the pharmacy for the refill. If patient does not wish to contact the pharmacy document the reason why and proceed with request.) (Agent: If yes, when and what did the pharmacy advise?)  Is this the correct pharmacy for this prescription? Yes If no, delete pharmacy and type the correct one.  This is the patient's preferred pharmacy:   [id number] [place], [place] - [address] [place] [address] Phone: [phone] Fax: [phone]   Has the prescription been filled recently? Yes  Is the patient out of the medication? Yes  Has the patient been seen for an appointment in the last year OR does the patient have an upcoming appointment? Yes  Can we respond through MyChart? Yes  Agent: Please be advised that Rx refills may take up to 3 business days. We ask that you follow-up with your pharmacy.

## 2023-05-21 DIAGNOSIS — H04123 Dry eye syndrome of bilateral lacrimal glands: Secondary | ICD-10-CM | POA: Diagnosis not present

## 2023-05-21 DIAGNOSIS — H25813 Combined forms of age-related cataract, bilateral: Secondary | ICD-10-CM | POA: Diagnosis not present

## 2023-08-19 ENCOUNTER — Ambulatory Visit

## 2023-09-02 ENCOUNTER — Ambulatory Visit (INDEPENDENT_AMBULATORY_CARE_PROVIDER_SITE_OTHER): Payer: Medicare Other | Admitting: Internal Medicine

## 2023-09-02 ENCOUNTER — Encounter: Payer: Self-pay | Admitting: Internal Medicine

## 2023-09-02 VITALS — BP 136/82 | HR 93 | Temp 98.3°F | Ht 64.0 in | Wt 262.0 lb

## 2023-09-02 DIAGNOSIS — I10 Essential (primary) hypertension: Secondary | ICD-10-CM | POA: Diagnosis not present

## 2023-09-02 DIAGNOSIS — R9431 Abnormal electrocardiogram [ECG] [EKG]: Secondary | ICD-10-CM

## 2023-09-02 DIAGNOSIS — E782 Mixed hyperlipidemia: Secondary | ICD-10-CM | POA: Diagnosis not present

## 2023-09-02 DIAGNOSIS — E559 Vitamin D deficiency, unspecified: Secondary | ICD-10-CM | POA: Diagnosis not present

## 2023-09-02 DIAGNOSIS — R739 Hyperglycemia, unspecified: Secondary | ICD-10-CM

## 2023-09-02 MED ORDER — ROSUVASTATIN CALCIUM 20 MG PO TABS: 20.0000 mg | ORAL_TABLET | Freq: Every day | ORAL | 3 refills | Status: DC

## 2023-09-02 NOTE — Assessment & Plan Note (Signed)
 BP Readings from Last 3 Encounters:  09/02/23 136/82  03/04/23 126/80  10/09/22 127/75   Borderline elevated mild uncontrolled,  pt to continue medical treatment avapro  300 mg every day as declines other change

## 2023-09-02 NOTE — Assessment & Plan Note (Signed)
 Lab Results  Component Value Date   LDLCALC 101 (H) 03/04/2023   Uncontrolled,, pt to increase crestor  to 20 mg every day, also for Card Ct score

## 2023-09-02 NOTE — Assessment & Plan Note (Signed)
 Lab Results  Component Value Date   HGBA1C 6.2 03/04/2023   Stable, pt to continue current medical treatment  - diet,wt control

## 2023-09-02 NOTE — Progress Notes (Signed)
 Patient ID: Brandy Bowman, female   DOB: [birth year], 73 y.o.   MRN: [id number]        Chief Complaint: follow up yearly exam, left knee end stage DJD, hld, htn, low vit d       HPI:  Brandy Bowman is a 73 y.o. female here overall doing ok, has contd end stage left knee DJD with limted mobility, now with walker, no falls, again declines ortho follow up as does not want surgury;  Pt denies chest pain, increased sob or doe, wheezing, orthopnea, PND, increased LE swelling, palpitations, dizziness or syncope.   Pt denies polydipsia, polyuria, or new focal neuro s/s.    Pt denies fever, wt loss, night sweats, loss of appetite, or other constitutional symptoms       Wt Readings from Last 3 Encounters:  09/02/23 262 lb (118.8 kg)  03/04/23 260 lb (117.9 kg)  10/09/22 259 lb 9.6 oz (117.8 kg)   BP Readings from Last 3 Encounters:  09/02/23 136/82  03/04/23 126/80  10/09/22 127/75         Past Medical History:  Diagnosis Date   Allergy    Anxiety    Arthritis    Depression    Hyperlipidemia    Hypertension    IFG (impaired fasting glucose)    A1c's all 5.9% or less   Vitamin D  deficiency    Past Surgical History:  Procedure Laterality Date   TUBAL LIGATION      reports that she has never smoked. She has never used smokeless tobacco. She reports that she does not drink alcohol and does not use drugs. family history includes Dementia in her mother; Pneumonia in her father; Rheum arthritis in her paternal uncle. No Known Allergies Current Outpatient Medications on File Prior to Visit  Medication Sig Dispense Refill   Cholecalciferol (VITAMIN D -3 PO) Take 2,000 Int'l Units by mouth daily.     irbesartan  (AVAPRO ) 300 MG tablet Take 1 tablet (300 mg total) by mouth at bedtime. 90 tablet 1   Multiple Vitamins-Minerals (CENTRUM ULTRA WOMENS PO) Take by mouth daily.     naproxen  sodium (ALEVE ) 220 MG tablet Take 220 mg by mouth in the morning and at bedtime.     venlafaxine  XR  (EFFEXOR -XR) 75 MG 24 hr capsule TAKE 1 CAPSULE BY MOUTH EVERY DAY 90 capsule 1   No current facility-administered medications on file prior to visit.        ROS:  All others reviewed and negative.  Objective        PE:  BP 136/82 (BP Location: Left Arm, Patient Position: Sitting, Cuff Size: Large)   Pulse 93   Temp 98.3 F (36.8 C) (Oral)   Ht 5\' 4"  (1.626 m)   Wt 262 lb (118.8 kg)   SpO2 97%   BMI 44.97 kg/m                 Constitutional: Pt appears in NAD               HENT: Head: NCAT.                Right Ear: External ear normal.                 Left Ear: External ear normal.                Eyes: . Pupils are equal, round, and reactive to light. Conjunctivae and EOM are normal  Nose: without d/c or deformity               Neck: Neck supple. Gross normal ROM               Cardiovascular: Normal rate and regular rhythm.                 Pulmonary/Chest: Effort normal and breath sounds without rales or wheezing.                Abd:  Soft, NT, ND, + BS, no organomegaly               Neurological: Pt is alert. At baseline orientation, motor grossly intact               Skin: Skin is warm. No rashes, no other new lesions, LE edema - none               Psychiatric: Pt behavior is normal without agitation   Micro: none  Cardiac tracings I have personally interpreted today:  none  Pertinent Radiological findings (summarize): none   Lab Results  Component Value Date   WBC 8.9 03/04/2023   HGB 13.8 03/04/2023   HCT 42.0 03/04/2023   PLT 304.0 03/04/2023   GLUCOSE 97 03/04/2023   CHOL 186 03/04/2023   TRIG 189.0 (H) 03/04/2023   HDL 47.10 03/04/2023   LDLDIRECT 141.0 06/20/2020   LDLCALC 101 (H) 03/04/2023   ALT 21 03/04/2023   AST 20 03/04/2023   NA 142 03/04/2023   K 3.9 03/04/2023   CL 105 03/04/2023   CREATININE 0.57 03/04/2023   BUN 17 03/04/2023   CO2 29 03/04/2023   TSH 3.09 03/04/2023   HGBA1C 6.2 03/04/2023   MICROALBUR 0.9 03/04/2023    Assessment/Plan:  Brandy Bowman is a 73 y.o. White or Caucasian [1] female with  has a past medical history of Allergy, Anxiety, Arthritis, Depression, Hyperlipidemia, Hypertension, IFG (impaired fasting glucose), and Vitamin D  deficiency.  Essential hypertension, benign BP Readings from Last 3 Encounters:  09/02/23 136/82  03/04/23 126/80  10/09/22 127/75   Borderline elevated mild uncontrolled,  pt to continue medical treatment avapro  300 mg every day as declines other change   Vitamin D  deficiency Last vitamin D  Lab Results  Component Value Date   VD25OH 30.69 03/04/2023   Low, to start oral replacement   Hyperlipidemia Lab Results  Component Value Date   LDLCALC 101 (H) 03/04/2023   Uncontrolled,, pt to increase crestor  to 20 mg every day, also for Card Ct score   Hyperglycemia Lab Results  Component Value Date   HGBA1C 6.2 03/04/2023   Stable, pt to continue current medical treatment  - diet, wt control  Followup: Return in about 6 months (around 03/04/2024).  Rosalia Colonel, MD 09/02/2023 9:42 AM [facility] - [facility] Internal Medicine

## 2023-09-02 NOTE — Assessment & Plan Note (Signed)
 Last vitamin D Lab Results  Component Value Date   VD25OH 30.69 03/04/2023   Low, to start oral replacement

## 2023-09-02 NOTE — Patient Instructions (Addendum)
 Ok to increase the crestor  to 20 mg per day  Please continue all other medications as before, and refills have been done if requested.  Please have the pharmacy call with any other refills you may need.  Please continue your efforts at being more active, low cholesterol diet, and weight control.  You are otherwise up to date with prevention measures today.  Please keep your appointments with your specialists as you may have planned  You will be contacted regarding the referral for: Cardiac CT score   We can hold on further lab testing today  Please make an Appointment to return in 6 months, or sooner if needed

## 2023-09-10 ENCOUNTER — Ambulatory Visit (INDEPENDENT_AMBULATORY_CARE_PROVIDER_SITE_OTHER)

## 2023-09-10 VITALS — Ht 64.0 in | Wt 262.0 lb

## 2023-09-10 DIAGNOSIS — Z0000 Encounter for general adult medical examination without abnormal findings: Secondary | ICD-10-CM

## 2023-09-10 NOTE — Progress Notes (Signed)
 Subjective:   Brandy Bowman is a 73 y.o. who presents for a Medicare Wellness preventive visit.  As a reminder, Annual Wellness Visits don't include a physical exam, and some assessments may be limited, especially if this visit is performed virtually. We may recommend an in-person follow-up visit with your provider if needed.  Visit Complete: Virtual I connected with  Brandy Bowman on 09/10/23 by a video and audio enabled telemedicine application and verified that I am speaking with the correct person using two identifiers.  Patient Location: Home  Provider Location: Home Office  I discussed the limitations of evaluation and management by telemedicine. The patient expressed understanding and agreed to proceed.  Vital Signs: Because this visit was a virtual/telehealth visit, some criteria may be missing or patient reported. Any vitals not documented were not able to be obtained and vitals that have been documented are patient reported.  Persons Participating in Visit: Patient.  AWV Questionnaire: Yes: Patient Medicare AWV questionnaire was completed by the patient on 09/10/2023 AND on 08/29/2023/ Patient started the questionnaire on 08/29/2023 and went back to finish questionnaire on 09/10/2023 ; I have confirmed that all information answered by patient is correct and no changes since this date.  Cardiac Risk Factors include: advanced age (>34men, >39 women);hypertension;dyslipidemia;obesity (BMI >30kg/m2)     Objective:     Today's Vitals   09/10/23 1547  Weight: 262 lb (118.8 kg)  Height: 5\' 4"  (1.626 m)   Body mass index is 44.97 kg/m.     09/10/2023    3:53 PM 08/14/2022   10:58 AM 07/05/2021    2:24 PM 02/16/2020    3:05 PM 03/11/2017    9:22 AM 03/08/2016    8:44 AM  Advanced Directives  Does Patient Have a Medical Advance Directive? Yes Yes Yes Yes No No  Type of Estate agent of [place];Living will Healthcare Power of [place];Living will  Healthcare Power of [place];Living will Healthcare Power of [place];Living will    Does patient want to make changes to medical advance directive?   No - Patient declined     Copy of Healthcare Power of Attorney in Chart? No - copy requested No - copy requested No - copy requested No - copy requested    Would patient like information on creating a medical advance directive?     No - Patient declined Yes (MAU/Ambulatory/Procedural Areas - Information given)    Current Medications (verified) Outpatient Encounter Medications as of 09/10/2023  Medication Sig   Cholecalciferol (VITAMIN D -3 PO) Take 2,000 Int'l Units by mouth daily.   irbesartan  (AVAPRO ) 300 MG tablet Take 1 tablet (300 mg total) by mouth at bedtime.   Multiple Vitamins-Minerals (CENTRUM ULTRA WOMENS PO) Take by mouth daily.   naproxen  sodium (ALEVE ) 220 MG tablet Take 220 mg by mouth in the morning and at bedtime.   rosuvastatin  (CRESTOR ) 20 MG tablet Take 1 tablet (20 mg total) by mouth daily.   venlafaxine  XR (EFFEXOR -XR) 75 MG 24 hr capsule TAKE 1 CAPSULE BY MOUTH EVERY DAY   No facility-administered encounter medications on file as of 09/10/2023.    Allergies (verified) Patient has no known allergies.   History: Past Medical History:  Diagnosis Date   Allergy    Anxiety    Arthritis    Depression    Hyperlipidemia    Hypertension    IFG (impaired fasting glucose)    A1c's all 5.9% or less   Vitamin D  deficiency    Past Surgical History:  Procedure Laterality Date   TUBAL LIGATION     Family History  Problem Relation Age of Onset   Dementia Mother    Pneumonia Father    Rheum arthritis Paternal Uncle    Social History   Socioeconomic History   Marital status: Widowed    Spouse name: Not on file   Number of children: 2   Years of education: Not on file   Highest education level: Master's degree (e.g., MA, MS, MEng, MEd, MSW, MBA)  Occupational History   Occupation: retired Agricultural consultant  Tobacco  Use   Smoking status: Never   Smokeless tobacco: Never  Vaping Use   Vaping status: Never Used  Substance and Sexual Activity   Alcohol use: No   Drug use: No   Sexual activity: Never  Other Topics Concern   Not on file  Social History Narrative   ** Merged History Encounter **       Retired 4 years ago from [employer]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 Currently lives with husband who is several years older and is independent, but has some forgetfulness. Daughter lives in [place]. Does not have contact with son. Enjoys yard work.       Lives with her daughter/2025   Social Drivers of Health   Financial Resource Strain: Low Risk  (08/29/2023)   Overall Financial Resource Strain (CARDIA)    Difficulty of Paying Living Expenses: Not hard at all  Food Insecurity: No Food Insecurity (08/29/2023)   Hunger Vital Sign    Worried About Running Out of Food in the Last Year: Never true    Ran Out of Food in the Last Year: Never true  Transportation Needs: No Transportation Needs (09/10/2023)   PRAPARE - Administrator, Civil Service (Medical): No    Lack of Transportation (Non-Medical): No  Physical Activity: Inactive (09/10/2023)   Exercise Vital Sign    Days of Exercise per Week: 0 days    Minutes of Exercise per Session: 0 min  Stress: No Stress Concern Present (09/10/2023)   [employer] of Occupational Health - Occupational Stress Questionnaire    Feeling of Stress : Not at all  Social Connections: Socially Isolated (09/10/2023)   Social Connection and Isolation Panel [NHANES]    Frequency of Communication with Friends and Family: Never    Frequency of Social Gatherings with Friends and Family: Never    Attends Religious Services: Never    Database administrator or Organizations: No    Attends Engineer, structural: Not on file    Marital Status: Widowed    Tobacco Counseling Counseling given: Not Answered    Clinical Intake:  Pre-visit preparation  completed: Yes  Pain : No/denies pain     BMI - recorded: 44.97 Nutritional Status: BMI > 30  Obese Nutritional Risks: None  Lab Results  Component Value Date   HGBA1C 6.2 03/04/2023   HGBA1C 6.0 04/24/2022   HGBA1C 5.5 06/20/2020     How often do you need to have someone help you when you read instructions, pamphlets, or other written materials from your doctor or pharmacy?: 1 - Never  Interpreter Needed?: No  Information entered by :: Carlotta Telfair, RMA   Activities of Daily Living     09/10/2023    8:40 AM  In your present state of health, do you have any difficulty performing the following activities:  Hearing? 0  Vision? 0  Difficulty concentrating or making decisions? 0  Walking  or climbing stairs? 1  Dressing or bathing? 0  Doing errands, shopping? 0  Preparing Food and eating ? N  Using the Toilet? N  In the past six months, have you accidently leaked urine? N  Do you have problems with loss of bowel control? N  Managing your Finances? N  Housekeeping or managing your Housekeeping? N    Patient Care Team: Roslyn Coombe, MD as PCP - General (Internal Medicine)  I have updated your Care Teams any recent Medical Services you may have received from other providers in the past year.     Assessment:    This is a routine wellness examination for Brandy Bowman.  Hearing/Vision screen Hearing Screening - Comments:: Denies hearing difficulties   Vision Screening - Comments:: Wears eyeglasses/[facility]   Goals Addressed   None    Depression Screen     09/10/2023    3:49 PM 09/02/2023    8:57 AM 03/04/2023   10:50 AM 08/14/2022   10:56 AM 04/24/2022    8:48 AM 11/06/2021    8:09 AM 07/05/2021    2:19 PM  PHQ 2/9 Scores  PHQ - 2 Score 0 0 0 0 0 0 0  PHQ- 9 Score 0 0   0 0     Fall Risk     09/10/2023    8:40 AM 09/02/2023    9:10 AM 03/04/2023   10:49 AM 08/14/2022   10:58 AM 04/24/2022    8:45 AM  Fall Risk   Falls in the past year? 0 0 0 0 0  Number  falls in past yr:  0 0 0 0  Injury with Fall?  0 0 0 0  Risk for fall due to :  No Fall Risks No Fall Risks Impaired vision No Fall Risks  Follow up Falls evaluation completed;Falls prevention discussed Falls evaluation completed Falls evaluation completed Falls prevention discussed Falls evaluation completed    MEDICARE RISK AT HOME:  Medicare Risk at Home Any stairs in or around the home?: (Patient-Rptd) Yes If so, are there any without handrails?: (Patient-Rptd) No Home free of loose throw rugs in walkways, pet beds, electrical cords, etc?: (Patient-Rptd) Yes Adequate lighting in your home to reduce risk of falls?: (Patient-Rptd) Yes Life alert?: (Patient-Rptd) No Use of a cane, walker or w/c?: (Patient-Rptd) Yes Grab bars in the bathroom?: (Patient-Rptd) Yes Shower chair or bench in shower?: (Patient-Rptd) Yes Elevated toilet seat or a handicapped toilet?: (Patient-Rptd) No  TIMED UP AND GO:  Was the test performed?  No  Cognitive Function: Declined/Normal: No cognitive concerns noted by patient or family. Patient alert, oriented, able to answer questions appropriately and recall recent events. No signs of memory loss or confusion.        08/14/2022   10:59 AM 07/05/2021    3:35 PM  6CIT Screen  What Year? 0 points 0 points  What month? 0 points 0 points  What time? 0 points 0 points  Count back from 20 0 points 0 points  Months in reverse 0 points 0 points  Repeat phrase 0 points 0 points  Total Score 0 points 0 points    Immunizations Immunization History  Administered Date(s) Administered   Fluad Quad(high Dose 65+) 12/16/2018, 12/20/2019, 02/11/2022   Influenza Split 12/28/2009   Influenza, High Dose Seasonal PF 01/05/2016, 01/20/2017, 01/28/2018, 01/28/2021, 01/14/2023   Influenza,inj,Quad PF,6+ Mos 01/13/2014, 01/06/2015   Moderna Covid-19 Fall Seasonal Vaccine 53yrs & older 01/14/2023   PFIZER Comirnaty(Gray Top)Covid-19 Tri-Sucrose Vaccine  11/04/2020    PFIZER(Purple Top)SARS-COV-2 Vaccination 05/15/2019, 06/09/2019, 01/14/2020   Pfizer Covid-19 Vaccine Bivalent Booster 9yrs & up 01/28/2021   Pfizer(Comirnaty)Fall Seasonal Vaccine 12 years and older 02/11/2022   Pneumococcal Conjugate-13 09/07/2015   Pneumococcal Polysaccharide-23 04/10/2017   Tdap 04/09/2011    Screening Tests Health Maintenance  Topic Date Due   MAMMOGRAM  06/07/2016   DEXA SCAN  09/27/2019   Medicare Annual Wellness (AWV)  08/14/2023   DTaP/Tdap/Td (2 - Td or Tdap) 03/03/2024 (Originally 04/08/2021)   INFLUENZA VACCINE  11/07/2023   Fecal DNA (Cologuard)  07/20/2024   Pneumonia Vaccine 71+ Years old  Completed   Hepatitis C Screening  Completed   HPV VACCINES  Aged Out   Meningococcal B Vaccine  Aged Out   Colonoscopy  Discontinued   COVID-19 Vaccine  Discontinued   Zoster Vaccines- Shingrix  Discontinued    Health Maintenance  Health Maintenance Due  Topic Date Due   MAMMOGRAM  06/07/2016   DEXA SCAN  09/27/2019   Medicare Annual Wellness (AWV)  08/14/2023   Health Maintenance Items Addressed: See Nurse Notes at the end of this note  Additional Screening:  Vision Screening: Recommended annual ophthalmology exams for early detection of glaucoma and other disorders of the eye. Would you like a referral to an eye doctor? No    Dental Screening: Recommended annual dental exams for proper oral hygiene  Community Resource Referral / Chronic Care Management: CRR required this visit?  No   CCM required this visit?  No   Plan:    I have personally reviewed and noted the following in the patient's chart:   Medical and social history Use of alcohol, tobacco or illicit drugs  Current medications and supplements including opioid prescriptions. Patient is not currently taking opioid prescriptions. Functional ability and status Nutritional status Physical activity Advanced directives List of other physicians Hospitalizations, surgeries, and ER  visits in previous 12 months Vitals Screenings to include cognitive, depression, and falls Referrals and appointments  In addition, I have reviewed and discussed with patient certain preventive protocols, quality metrics, and best practice recommendations. A written personalized care plan for preventive services as well as general preventive health recommendations were provided to patient.   Aaren Krog L Marven Veley, CMA   09/10/2023   After Visit Summary: (MyChart) Due to this being a telephonic visit, the after visit summary with patients personalized plan was offered to patient via MyChart   Notes: Please refer to Routing Comments.

## 2023-09-10 NOTE — Patient Instructions (Signed)
 Brandy Bowman , Thank you for taking time out of your busy schedule to complete your Annual Wellness Visit with me. I enjoyed our conversation and look forward to speaking with you again next year. I, as well as your care team,  appreciate your ongoing commitment to your health goals. Please review the following plan we discussed and let me know if I can assist you in the future. Your Game plan/ To Do List   Follow up Visits: Next Medicare AWV with our clinical staff: 09/10/2024.   Have you seen your provider in the last 6 months (3 months if uncontrolled diabetes)? Yes Next Office Visit with your provider: 03/08/2024.  Clinician Recommendations:  Aim for 30 minutes of exercise or brisk walking, 6-8 glasses of water, and 5 servings of fruits and vegetables each day. You will be due for a tetanus vaccine starting in Nov 2025.  You can get this done at your local pharmacy.      This is a list of the screening recommended for you and due dates:  Health Maintenance  Topic Date Due   Mammogram  06/07/2016   DEXA scan (bone density measurement)  09/27/2019   Medicare Annual Wellness Visit  08/14/2023   DTaP/Tdap/Td vaccine (2 - Td or Tdap) 03/03/2024*   Flu Shot  11/07/2023   Cologuard (Stool DNA test)  07/20/2024   Pneumonia Vaccine  Completed   Hepatitis C Screening  Completed   HPV Vaccine  Aged Out   Meningitis B Vaccine  Aged Out   Colon Cancer Screening  Discontinued   COVID-19 Vaccine  Discontinued   Zoster (Shingles) Vaccine  Discontinued  *Topic was postponed. The date shown is not the original due date.    Advanced directives: (Copy Requested) Please bring a copy of your health care power of attorney and living will to the office to be added to your chart at your convenience. You can mail to [facility] [address] [place], [place] [address] or email to [email or web address] Advance Care Planning is important because it:  [x]  Makes sure you receive the  medical care that is consistent with your values, goals, and preferences  [x]  It provides guidance to your family and loved ones and reduces their decisional burden about whether or not they are making the right decisions based on your wishes.  Follow the link provided in your after visit summary or read over the paperwork we have mailed to you to help you started getting your Advance Directives in place. If you need assistance in completing these, please reach out to us  so that we can help you!  See attachments for Preventive Care and Fall Prevention Tips.

## 2023-09-24 ENCOUNTER — Other Ambulatory Visit (HOSPITAL_BASED_OUTPATIENT_CLINIC_OR_DEPARTMENT_OTHER)

## 2023-11-07 ENCOUNTER — Other Ambulatory Visit: Payer: Self-pay | Admitting: Internal Medicine

## 2024-01-28 DIAGNOSIS — Z23 Encounter for immunization: Secondary | ICD-10-CM | POA: Diagnosis not present

## 2024-03-08 ENCOUNTER — Encounter: Payer: Self-pay | Admitting: Internal Medicine

## 2024-03-08 ENCOUNTER — Ambulatory Visit: Admitting: Internal Medicine

## 2024-03-08 VITALS — BP 134/82 | HR 90 | Temp 98.1°F | Ht 64.0 in | Wt 263.0 lb

## 2024-03-08 DIAGNOSIS — E559 Vitamin D deficiency, unspecified: Secondary | ICD-10-CM

## 2024-03-08 DIAGNOSIS — R739 Hyperglycemia, unspecified: Secondary | ICD-10-CM

## 2024-03-08 DIAGNOSIS — I10 Essential (primary) hypertension: Secondary | ICD-10-CM

## 2024-03-08 DIAGNOSIS — E782 Mixed hyperlipidemia: Secondary | ICD-10-CM | POA: Diagnosis not present

## 2024-03-08 LAB — LIPID PANEL
Cholesterol: 129 mg/dL (ref 0–200)
HDL: 44.7 mg/dL (ref >39.00)
LDL Cholesterol: 57 mg/dL (ref 0–99)
NonHDL: 83.99
Total CHOL/HDL Ratio: 3
Triglycerides: 136 mg/dL (ref 0.0–149.0)
VLDL: 27.2 mg/dL (ref 0.0–40.0)

## 2024-03-08 LAB — HEPATIC FUNCTION PANEL
ALT: 23 U/L (ref 0–35)
AST: 24 U/L (ref 0–37)
Albumin: 4.1 g/dL (ref 3.5–5.2)
Alkaline Phosphatase: 77 U/L (ref 39–117)
Bilirubin, Direct: 0.1 mg/dL (ref 0.0–0.3)
Total Bilirubin: 0.6 mg/dL (ref 0.2–1.2)
Total Protein: 7 g/dL (ref 6.0–8.3)

## 2024-03-08 LAB — URINALYSIS, ROUTINE W REFLEX MICROSCOPIC
Bilirubin Urine: NEGATIVE
Ketones, ur: NEGATIVE
Nitrite: NEGATIVE
Specific Gravity, Urine: 1.01 (ref 1.000–1.030)
Total Protein, Urine: NEGATIVE
Urine Glucose: NEGATIVE
Urobilinogen, UA: 0.2 (ref 0.0–1.0)
pH: 6 (ref 5.0–8.0)

## 2024-03-08 LAB — BASIC METABOLIC PANEL WITH GFR
BUN: 16 mg/dL (ref 6–23)
CO2: 29 meq/L (ref 19–32)
Calcium: 9.3 mg/dL (ref 8.4–10.5)
Chloride: 104 meq/L (ref 96–112)
Creatinine, Ser: 0.6 mg/dL (ref 0.40–1.20)
GFR: 88.7 mL/min (ref >60.00)
Glucose, Bld: 115 mg/dL — ABNORMAL HIGH (ref 70–99)
Potassium: 3.8 meq/L (ref 3.5–5.1)
Sodium: 142 meq/L (ref 135–145)

## 2024-03-08 LAB — CBC WITH DIFFERENTIAL/PLATELET
Basophils Absolute: 0.1 K/uL (ref 0.0–0.1)
Basophils Relative: 1.1 % (ref 0.0–3.0)
Eosinophils Absolute: 0.2 K/uL (ref 0.0–0.7)
Eosinophils Relative: 2.6 % (ref 0.0–5.0)
HCT: 40.1 % (ref 36.0–46.0)
Hemoglobin: 13.5 g/dL (ref 12.0–15.0)
Lymphocytes Relative: 26.6 % (ref 12.0–46.0)
Lymphs Abs: 1.9 K/uL (ref 0.7–4.0)
MCHC: 33.6 g/dL (ref 30.0–36.0)
MCV: 89.3 fl (ref 78.0–100.0)
Monocytes Absolute: 0.4 K/uL (ref 0.1–1.0)
Monocytes Relative: 6 % (ref 3.0–12.0)
Neutro Abs: 4.6 K/uL (ref 1.4–7.7)
Neutrophils Relative %: 63.7 % (ref 43.0–77.0)
Platelets: 275 K/uL (ref 150.0–400.0)
RBC: 4.49 Mil/uL (ref 3.87–5.11)
RDW: 13.2 % (ref 11.5–15.5)
WBC: 7.3 K/uL (ref 4.0–10.5)

## 2024-03-08 LAB — TSH: TSH: 2.54 u[IU]/mL (ref 0.35–5.50)

## 2024-03-08 LAB — HEMOGLOBIN A1C: Hgb A1c MFr Bld: 6.1 % (ref 4.6–6.5)

## 2024-03-08 LAB — VITAMIN D 25 HYDROXY (VIT D DEFICIENCY, FRACTURES): VITD: 37.36 ng/mL (ref 30.00–100.00)

## 2024-03-08 MED ORDER — VENLAFAXINE HCL ER 75 MG PO CP24: 75.0000 mg | ORAL_CAPSULE | Freq: Every day | ORAL | 3 refills | Status: AC

## 2024-03-08 MED ORDER — IRBESARTAN 300 MG PO TABS: 300.0000 mg | ORAL_TABLET | Freq: Every day | ORAL | 3 refills | Status: AC

## 2024-03-08 MED ORDER — ROSUVASTATIN CALCIUM 20 MG PO TABS: 20.0000 mg | ORAL_TABLET | Freq: Every day | ORAL | 3 refills | Status: AC

## 2024-03-08 NOTE — Progress Notes (Signed)
 Patient ID: Brandy Bowman, female   DOB: 09/01/1950, 73 y.o.   MRN: [id number]        Chief Complaint: follow up HTN, HLD and hyperglycemia , low vit d       HPI:  Brandy Bowman is a 73 y.o. female here overall doing ok, Pt denies chest pain, increased sob or doe, wheezing, orthopnea, PND, increased LE swelling, palpitations, dizziness or syncope.   Pt denies polydipsia, polyuria, or new focal neuro s/s.    Pt denies fever, wt loss, night sweats, loss of appetite, or other constitutional symptoms  Does have ongoing right > left knee pain and marked reduced ROM, but no falls, does well with walker and declines any ortho evaluation.         Wt Readings from Last 3 Encounters:  03/08/24 263 lb (119.3 kg)  09/10/23 262 lb (118.8 kg)  09/02/23 262 lb (118.8 kg)   BP Readings from Last 3 Encounters:  03/08/24 134/82  09/02/23 136/82  03/04/23 126/80         Past Medical History:  Diagnosis Date   Allergy    Anxiety    Arthritis    Depression    Hyperlipidemia    Hypertension    IFG (impaired fasting glucose)    A1c's all 5.9% or less   Vitamin D  deficiency    Past Surgical History:  Procedure Laterality Date   TUBAL LIGATION      reports that she has never smoked. She has never used smokeless tobacco. She reports that she does not drink alcohol and does not use drugs. family history includes Dementia in her mother; Pneumonia in her father; Rheum arthritis in her paternal uncle. No Known Allergies Current Outpatient Medications on File Prior to Visit  Medication Sig Dispense Refill   Cholecalciferol (VITAMIN D -3 PO) Take 2,000 Int'l Units by mouth daily.     Multiple Vitamins-Minerals (CENTRUM ULTRA WOMENS PO) Take by mouth daily.     naproxen  sodium (ALEVE ) 220 MG tablet Take 220 mg by mouth in the morning and at bedtime.     No current facility-administered medications on file prior to visit.        ROS:  All others reviewed and negative.  Objective        PE:  BP  134/82 (BP Location: Left Arm, Patient Position: Sitting, Cuff Size: Normal)   Pulse 90   Temp 98.1 F (36.7 C) (Oral)   Ht 5' 4 (1.626 m)   Wt 263 lb (119.3 kg)   SpO2 96%   BMI 45.14 kg/m                 Constitutional: Pt appears in NAD               HENT: Head: NCAT.                Right Ear: External ear normal.                 Left Ear: External ear normal.                Eyes: . Pupils are equal, round, and reactive to light. Conjunctivae and EOM are normal               Nose: without d/c or deformity               Neck: Neck supple. Gross normal ROM  Cardiovascular: Normal rate and regular rhythm.                 Pulmonary/Chest: Effort normal and breath sounds without rales or wheezing.                Abd:  Soft, NT, ND, + BS, no organomegaly               Neurological: Pt is alert. At baseline orientation, motor grossly intact               Skin: Skin is warm. No rashes, no other new lesions, LE edema - trace pedal bilateral               Psychiatric: Pt behavior is normal without agitation   Micro: none  Cardiac tracings I have personally interpreted today:  none  Pertinent Radiological findings (summarize): none   Lab Results  Component Value Date   WBC 8.9 03/04/2023   HGB 13.8 03/04/2023   HCT 42.0 03/04/2023   PLT 304.0 03/04/2023   GLUCOSE 97 03/04/2023   CHOL 186 03/04/2023   TRIG 189.0 (H) 03/04/2023   HDL 47.10 03/04/2023   LDLDIRECT 141.0 06/20/2020   LDLCALC 101 (H) 03/04/2023   ALT 21 03/04/2023   AST 20 03/04/2023   NA 142 03/04/2023   K 3.9 03/04/2023   CL 105 03/04/2023   CREATININE 0.57 03/04/2023   BUN 17 03/04/2023   CO2 29 03/04/2023   TSH 3.09 03/04/2023   HGBA1C 6.2 03/04/2023   MICROALBUR 4.8 10/08/2016   Assessment/Plan:  Brandy Bowman is a 73 y.o. White or Caucasian [1] female with  has a past medical history of Allergy, Anxiety, Arthritis, Depression, Hyperlipidemia, Hypertension, IFG (impaired fasting  glucose), and Vitamin D  deficiency.  Vitamin D  deficiency Last vitamin D  Lab Results  Component Value Date   VD25OH 30.69 03/04/2023   Low, to start oral replacement, f/u lab today   Hyperlipidemia Lab Results  Component Value Date   LDLCALC 101 (H) 03/04/2023   Uncontrolled but tolerating crestor  and lower chol diet, pt to continue current statin crestor  20 mg every day and f/u lab today   Hyperglycemia Lab Results  Component Value Date   HGBA1C 6.2 03/04/2023   Stable, pt to continue current medical treatment  - diet, wt control   Essential hypertension, benign BP Readings from Last 3 Encounters:  03/08/24 134/82  09/02/23 136/82  03/04/23 126/80   Stable, pt to continue medical treatment avapro  300 qd  Followup: Return in about 6 months (around 09/06/2024).  Lynwood Rush, MD 03/08/2024 9:32 AM [facility] Internal Medicine

## 2024-03-08 NOTE — Assessment & Plan Note (Signed)
 Lab Results  Component Value Date   LDLCALC 101 (H) 03/04/2023   Uncontrolled but tolerating crestor  and lower chol diet, pt to continue current statin crestor  20 mg every day and f/u lab today

## 2024-03-08 NOTE — Patient Instructions (Signed)
 Please continue all other medications as before, and refills have been done if requested.  Please have the pharmacy call with any other refills you may need.  Please continue your efforts at being more active, low cholesterol diet, and weight control.  Please call if you change your mind about the mammogram and bone density testing  Please keep your appointments with your specialists as you may have planned  Please go to the LAB at the blood drawing area for the tests to be done  You will be contacted by phone if any changes need to be made immediately.  Otherwise, you will receive a letter about your results with an explanation, but please check with MyChart first.  Please make an Appointment to return in 6 months, or sooner if needed

## 2024-03-08 NOTE — Assessment & Plan Note (Signed)
 Lab Results  Component Value Date   HGBA1C 6.2 03/04/2023   Stable, pt to continue current medical treatment  - diet,wt control

## 2024-03-08 NOTE — Assessment & Plan Note (Addendum)
 Last vitamin D  Lab Results  Component Value Date   VD25OH 30.69 03/04/2023   Low, to start oral replacement, f/u lab today

## 2024-03-08 NOTE — Assessment & Plan Note (Signed)
 BP Readings from Last 3 Encounters:  03/08/24 134/82  09/02/23 136/82  03/04/23 126/80   Stable, pt to continue medical treatment avapro  300 qd

## 2024-03-09 ENCOUNTER — Ambulatory Visit: Payer: Self-pay | Admitting: Internal Medicine

## 2024-03-09 NOTE — Progress Notes (Signed)
 The test results show that your current treatment is OK, as the tests are stable.  Please continue the same plan.  There is no other need for change of treatment or further evaluation based on these results, at this time.  thanks

## 2024-04-05 ENCOUNTER — Telehealth: Payer: Self-pay | Admitting: Internal Medicine

## 2024-04-05 NOTE — Telephone Encounter (Signed)
 Patient dropped off document Handicap Placard, to be filled out by provider. Patient requested to send it back via Call Patient to pick up within 7-days. Document is located in providers tray at front office.Please advise at [phone]

## 2024-04-06 NOTE — Telephone Encounter (Signed)
 Called and let Pt know her form is ready for pick up.

## 2024-09-07 ENCOUNTER — Ambulatory Visit: Admitting: Family Medicine

## 2024-09-10 ENCOUNTER — Ambulatory Visit
